# Patient Record
Sex: Male | Born: 1940 | Race: White | Hispanic: No | Marital: Married | Smoking: Former smoker
Health system: Southern US, Community
[De-identification: ages and names within clinical notes are randomized; demographics above are authoritative.]

## PROBLEM LIST (undated history)

## (undated) DIAGNOSIS — F32A Depression, unspecified: Secondary | ICD-10-CM

## (undated) DIAGNOSIS — I10 Essential (primary) hypertension: Secondary | ICD-10-CM

## (undated) DIAGNOSIS — F0280 Dementia in other diseases classified elsewhere without behavioral disturbance: Secondary | ICD-10-CM

## (undated) DIAGNOSIS — M21379 Foot drop, unspecified foot: Secondary | ICD-10-CM

## (undated) DIAGNOSIS — G309 Alzheimer's disease, unspecified: Secondary | ICD-10-CM

## (undated) DIAGNOSIS — H9190 Unspecified hearing loss, unspecified ear: Secondary | ICD-10-CM

## (undated) DIAGNOSIS — F0390 Unspecified dementia without behavioral disturbance: Secondary | ICD-10-CM

## (undated) DIAGNOSIS — F329 Major depressive disorder, single episode, unspecified: Secondary | ICD-10-CM

## (undated) HISTORY — PX: NO PAST SURGERIES: SHX2092

---

## 2013-01-14 ENCOUNTER — Other Ambulatory Visit: Payer: Self-pay

## 2013-01-14 ENCOUNTER — Encounter (HOSPITAL_COMMUNITY): Payer: Self-pay | Admitting: Emergency Medicine

## 2013-01-14 ENCOUNTER — Emergency Department (HOSPITAL_COMMUNITY)
Admission: EM | Admit: 2013-01-14 | Discharge: 2013-01-15 | Disposition: A | Discharge status: Home / Self Care | Payer: Medicare Other | Attending: Emergency Medicine | Admitting: Emergency Medicine

## 2013-01-14 DIAGNOSIS — Y9210 Unspecified residential institution as the place of occurrence of the external cause: Secondary | ICD-10-CM | POA: Insufficient documentation

## 2013-01-14 DIAGNOSIS — IMO0002 Reserved for concepts with insufficient information to code with codable children: Secondary | ICD-10-CM | POA: Insufficient documentation

## 2013-01-14 DIAGNOSIS — W19XXXA Unspecified fall, initial encounter: Secondary | ICD-10-CM

## 2013-01-14 DIAGNOSIS — Z23 Encounter for immunization: Secondary | ICD-10-CM | POA: Insufficient documentation

## 2013-01-14 DIAGNOSIS — E041 Nontoxic single thyroid nodule: Secondary | ICD-10-CM | POA: Insufficient documentation

## 2013-01-14 DIAGNOSIS — S0003XA Contusion of scalp, initial encounter: Secondary | ICD-10-CM | POA: Insufficient documentation

## 2013-01-14 DIAGNOSIS — W050XXA Fall from non-moving wheelchair, initial encounter: Secondary | ICD-10-CM | POA: Insufficient documentation

## 2013-01-14 DIAGNOSIS — Z79899 Other long term (current) drug therapy: Secondary | ICD-10-CM | POA: Insufficient documentation

## 2013-01-14 DIAGNOSIS — F0390 Unspecified dementia without behavioral disturbance: Secondary | ICD-10-CM | POA: Insufficient documentation

## 2013-01-14 DIAGNOSIS — S060X1A Concussion with loss of consciousness of 30 minutes or less, initial encounter: Secondary | ICD-10-CM | POA: Insufficient documentation

## 2013-01-14 DIAGNOSIS — T07XXXA Unspecified multiple injuries, initial encounter: Secondary | ICD-10-CM

## 2013-01-14 DIAGNOSIS — Y9389 Activity, other specified: Secondary | ICD-10-CM | POA: Insufficient documentation

## 2013-01-14 HISTORY — DX: Unspecified dementia, unspecified severity, without behavioral disturbance, psychotic disturbance, mood disturbance, and anxiety: F03.90

## 2013-01-14 NOTE — ED Notes (Signed)
Per EMS, pt is from [facility] with a fall from a wheelchair.  Pt was reaching for an object and fell forward.  Pt became unresponsive for several minutes.  Pt was diaphoretic and his eyes were twitching.  Pt responsive on arrival.  Pt with abrasions to face and cool to touch. Pt has hx of dementia. EKG unremarkable.

## 2013-01-15 ENCOUNTER — Emergency Department (HOSPITAL_COMMUNITY): Payer: Medicare Other

## 2013-01-15 LAB — CBC
MCHC: 34.8 g/dL (ref 30.0–36.0)
MCV: 90.2 fL (ref 78.0–100.0)
Platelets: 174 10*3/uL (ref 150–400)
RDW: 13.1 % (ref 11.5–15.5)
WBC: 5.5 10*3/uL (ref 4.0–10.5)

## 2013-01-15 LAB — BASIC METABOLIC PANEL
BUN: 15 mg/dL (ref 6–23)
Chloride: 106 mEq/L (ref 96–112)
Creatinine, Ser: 0.88 mg/dL (ref 0.50–1.35)
GFR calc Af Amer: >90 mL/min (ref >90)
GFR calc non Af Amer: 84 mL/min — ABNORMAL LOW (ref >90)
Potassium: 3.4 mEq/L — ABNORMAL LOW (ref 3.5–5.1)

## 2013-01-15 LAB — URINALYSIS, ROUTINE W REFLEX MICROSCOPIC
Bilirubin Urine: NEGATIVE
Hgb urine dipstick: NEGATIVE
Ketones, ur: NEGATIVE mg/dL
Leukocytes, UA: NEGATIVE
Nitrite: NEGATIVE
Protein, ur: NEGATIVE mg/dL
Specific Gravity, Urine: 1.023 (ref 1.005–1.030)
Urobilinogen, UA: 1 mg/dL (ref 0.0–1.0)
pH: 6.5 (ref 5.0–8.0)

## 2013-01-15 MED ORDER — POTASSIUM CHLORIDE CRYS ER 20 MEQ PO TBCR
40.0000 meq | EXTENDED_RELEASE_TABLET | Freq: Once | ORAL | Status: AC
  Administered 2013-01-15: 40 meq via ORAL
  Filled 2013-01-15: qty 2

## 2013-01-15 MED ORDER — SODIUM CHLORIDE 0.9 % IV SOLN: INTRAVENOUS | Status: DC

## 2013-01-15 MED ORDER — TETANUS-DIPHTH-ACELL PERTUSSIS 5-2.5-18.5 LF-MCG/0.5 IM SUSP
0.5000 mL | Freq: Once | INTRAMUSCULAR | Status: AC
  Administered 2013-01-15: 0.5 mL via INTRAMUSCULAR
  Filled 2013-01-15: qty 0.5

## 2013-01-15 NOTE — ED Notes (Signed)
Spoke with Tiffany from [facility].  Per facility, pt may have either fell forward and hit head against the wall or he may have been banging his head against the wall.  Pt with abrasion to face and right shoulder.  Per facility pt is known to be combative.

## 2013-01-15 NOTE — ED Provider Notes (Signed)
CSN: [id number]     Arrival date & time 01/14/13  2350 History   First MD Initiated Contact with Patient 01/14/13 2355     Chief Complaint  Patient presents with  . Fall   (Consider location/radiation/quality/duration/timing/severity/associated sxs/prior Treatment) HPI History provided by patient, the EMS report and nursing home. Has a history of dementia. Brought in by EMS report patient was sitting in a wheelchair fell forward striking his head with questionable brief LOC lasting a few minutes. On my valuations patient is awake, unable to recall events from earlier tonight. He denies any pain injury or trauma. Family present at bedside, confirmed history of dementia and states patient is at his baseline mentation currently. Level V caveat applies Past Medical History  Diagnosis Date  . Dementia    History reviewed. No pertinent past surgical history. History reviewed. No pertinent family history. History  Substance Use Topics  . Smoking status: Not on file  . Smokeless tobacco: Not on file  . Alcohol Use: Not on file    Review of Systems  Unable to perform ROS level V the caveat history of dementia  Allergies  Review of patient's allergies indicates no known allergies.  Home Medications   Current Outpatient Rx  Name  Route  Sig  Dispense  Refill  . acetaminophen (TYLENOL) 500 MG tablet   Oral   Take 500 mg by mouth every 4 (four) hours as needed for fever.         Marland Kitchen alum & mag hydroxide-simeth (MAALOX/MYLANTA) 200-200-20 MG/5ML suspension   Oral   Take 30 mLs by mouth every 6 (six) hours as needed for indigestion or heartburn.         Marland Kitchen atorvastatin (LIPITOR) 80 MG tablet   Oral   Take 80 mg by mouth daily.         . enalapril (VASOTEC) 20 MG tablet   Oral   Take 20 mg by mouth daily.         Marland Kitchen guaiFENesin (ROBITUSSIN) 100 MG/5ML SOLN   Oral   Take 10 mLs by mouth every 6 (six) hours as needed for cough or to loosen phlegm.         .  hydrochlorothiazide (HYDRODIURIL) 12.5 MG tablet   Oral   Take 12.5 mg by mouth daily.         Marland Kitchen loperamide (IMODIUM A-D) 2 MG tablet   Oral   Take 2 mg by mouth as needed for diarrhea or loose stools.         Marland Kitchen LORazepam (ATIVAN) 0.5 MG tablet   Oral   Take 0.5 mg by mouth 2 (two) times daily.         Marland Kitchen LORazepam (ATIVAN) 0.5 MG tablet   Oral   Take 0.5 mg by mouth every 6 (six) hours as needed for anxiety. Not to exceed 4 in 24 hours         . magnesium hydroxide (MILK OF MAGNESIA) 400 MG/5ML suspension   Oral   Take 30 mLs by mouth daily as needed for mild constipation.         . metoprolol (LOPRESSOR) 100 MG tablet   Oral   Take 100 mg by mouth 2 (two) times daily.         Marland Kitchen valproic acid (DEPAKENE) 250 MG capsule   Oral   Take 750 mg by mouth 2 (two) times daily.          BP 144/76  Pulse 84  Temp(Src) 98 F (36.7 C) (Oral)  SpO2 96% Physical Exam  Constitutional: He appears well-developed and well-nourished.  HENT:  Head: Normocephalic.  Abrasions to right forehead and right maxillary and mandibular region with some swelling. No trismus. No epistaxis. No entrapment with extraocular movements intact. TMs clear. No mastoid tenderness or swelling.  Eyes: EOM are normal. Pupils are equal, round, and reactive to light.  Neck: Neck supple.  No cervical spine tenderness or deformity  Cardiovascular: Normal rate, regular rhythm and intact distal pulses.   Pulmonary/Chest: Effort normal and breath sounds normal. No respiratory distress.  Abdominal: Soft. Bowel sounds are normal. He exhibits no distension. There is no tenderness.  Musculoskeletal: Normal range of motion. He exhibits no edema.  Abrasion over anterior right shoulder without swelling or deformity otherwise. Good range of motion with distal neurovascular intact x4  Neurological:  Awake, alert, oriented to self, no focal deficits  Skin: Skin is warm and dry.    ED Course  Procedures  (including critical care time) Labs Review Labs Reviewed  BASIC METABOLIC PANEL - Abnormal; Notable for the following:    Potassium 3.4 (*)    Glucose, Bld 114 (*)    GFR calc non Af Amer 84 (*)    All other components within normal limits  CBC  URINALYSIS, ROUTINE W REFLEX MICROSCOPIC   Imaging Review Dg Shoulder Right  01/15/2013   CLINICAL DATA:  Fall and abrasions to the shoulder.  EXAM: RIGHT SHOULDER - 2+ VIEW  COMPARISON:  None.  FINDINGS: The right shoulder is located. Negative for an acute fracture. Right AC joint is intact. Visualized right ribs are intact.  IMPRESSION: No acute bone abnormality to the right shoulder.   Electronically Signed   By: Richarda Overlie M.D.   On: 01/15/2013 01:12   Ct Head Wo Contrast  01/15/2013   CLINICAL DATA:  Status post fall from wheelchair. Patient was unresponsive for several minutes, with diaphoresis and eye twitching. Abrasions on the face. Concern for head or cervical spine injury.  EXAM: CT HEAD WITHOUT CONTRAST  CT MAXILLOFACIAL WITHOUT CONTRAST  CT CERVICAL SPINE WITHOUT CONTRAST  TECHNIQUE: Multidetector CT imaging of the head, cervical spine, and maxillofacial structures were performed using the standard protocol without intravenous contrast. Multiplanar CT image reconstructions of the cervical spine and maxillofacial structures were also generated.  COMPARISON:  None.  FINDINGS: CT HEAD FINDINGS  There is no evidence of acute infarction, mass lesion, or intra- or extra-axial hemorrhage on CT.  Prominence of the ventricles and sulci reflects mild to moderate cortical volume loss. Cerebellar atrophy is noted. Scattered periventricular white matter change likely reflects small vessel ischemic microangiopathy.  The brainstem and fourth ventricle are within normal limits. The basal ganglia are unremarkable in appearance. The cerebral hemispheres demonstrate grossly normal gray-white differentiation. No mass effect or midline shift is seen.  There is no  evidence of fracture; visualized osseous structures are unremarkable in appearance. The visualized portions of the orbits are within normal limits. The paranasal sinuses and mastoid air cells are well-aerated. Mild focal soft tissue injury is noted overlying the right zygomaticomaxillary complex.  CT MAXILLOFACIAL FINDINGS  There is no evidence of fracture or dislocation. The maxilla and mandible appear intact. The nasal bone is unremarkable in appearance. There is chronic absence of the dentition.  The orbits are intact bilaterally. The visualized paranasal sinuses and mastoid air cells are well-aerated.  Mild soft tissue swelling is noted overlying the right zygomaticomaxillary complex. The parapharyngeal fat planes  are preserved. The nasopharynx, oropharynx and hypopharynx are unremarkable in appearance. The visualized portions of the valleculae and piriform sinuses are grossly unremarkable.  The parotid and submandibular glands are within normal limits. No cervical lymphadenopathy is seen.  CT CERVICAL SPINE FINDINGS  There is no evidence of fracture or subluxation. Vertebral bodies demonstrate normal height. Mild reversal of the normal lordotic curvature of the cervical spine is chronic in nature. There is multilevel disc space narrowing along the lower cervical and upper thoracic spine, with chronic endplate irregularity, and scattered small anterior disc osteophyte complexes. Prevertebral soft tissues are within normal limits.  Focal hypodensity is noted within the right thyroid lobe, with peripheral calcification, measuring 1.6 cm. The visualized lung apices are clear. Calcification is noted at the carotid bifurcations bilaterally.  IMPRESSION: 1. No evidence of traumatic intracranial injury or fracture. 2. No evidence of fracture or subluxation along the cervical spine. 3. No evidence of fracture with regard to the maxillofacial structures. 4. Mild soft tissue swelling overlying the right  zygomaticomaxillary complex. 5. Mild degenerative change noted along the lower cervical and upper thoracic spine. 6. Mild to moderate cortical volume loss and scattered small vessel ischemic microangiopathy. 7. Calcification at the carotid bifurcations bilaterally. 8. Focal hypodensity within the right thyroid lobe, with peripheral calcification, measuring 1.6 cm. Consider further evaluation with thyroid ultrasound. If patient is clinically hyperthyroid, consider nuclear medicine thyroid uptake and scan.   Electronically Signed   By: Roanna Raider M.D.   On: 01/15/2013 01:27   Ct Cervical Spine Wo Contrast  01/15/2013   CLINICAL DATA:  Status post fall from wheelchair. Patient was unresponsive for several minutes, with diaphoresis and eye twitching. Abrasions on the face. Concern for head or cervical spine injury.  EXAM: CT HEAD WITHOUT CONTRAST  CT MAXILLOFACIAL WITHOUT CONTRAST  CT CERVICAL SPINE WITHOUT CONTRAST  TECHNIQUE: Multidetector CT imaging of the head, cervical spine, and maxillofacial structures were performed using the standard protocol without intravenous contrast. Multiplanar CT image reconstructions of the cervical spine and maxillofacial structures were also generated.  COMPARISON:  None.  FINDINGS: CT HEAD FINDINGS  There is no evidence of acute infarction, mass lesion, or intra- or extra-axial hemorrhage on CT.  Prominence of the ventricles and sulci reflects mild to moderate cortical volume loss. Cerebellar atrophy is noted. Scattered periventricular white matter change likely reflects small vessel ischemic microangiopathy.  The brainstem and fourth ventricle are within normal limits. The basal ganglia are unremarkable in appearance. The cerebral hemispheres demonstrate grossly normal gray-white differentiation. No mass effect or midline shift is seen.  There is no evidence of fracture; visualized osseous structures are unremarkable in appearance. The visualized portions of the orbits are  within normal limits. The paranasal sinuses and mastoid air cells are well-aerated. Mild focal soft tissue injury is noted overlying the right zygomaticomaxillary complex.  CT MAXILLOFACIAL FINDINGS  There is no evidence of fracture or dislocation. The maxilla and mandible appear intact. The nasal bone is unremarkable in appearance. There is chronic absence of the dentition.  The orbits are intact bilaterally. The visualized paranasal sinuses and mastoid air cells are well-aerated.  Mild soft tissue swelling is noted overlying the right zygomaticomaxillary complex. The parapharyngeal fat planes are preserved. The nasopharynx, oropharynx and hypopharynx are unremarkable in appearance. The visualized portions of the valleculae and piriform sinuses are grossly unremarkable.  The parotid and submandibular glands are within normal limits. No cervical lymphadenopathy is seen.  CT CERVICAL SPINE FINDINGS  There is no evidence  of fracture or subluxation. Vertebral bodies demonstrate normal height. Mild reversal of the normal lordotic curvature of the cervical spine is chronic in nature. There is multilevel disc space narrowing along the lower cervical and upper thoracic spine, with chronic endplate irregularity, and scattered small anterior disc osteophyte complexes. Prevertebral soft tissues are within normal limits.  Focal hypodensity is noted within the right thyroid lobe, with peripheral calcification, measuring 1.6 cm. The visualized lung apices are clear. Calcification is noted at the carotid bifurcations bilaterally.  IMPRESSION: 1. No evidence of traumatic intracranial injury or fracture. 2. No evidence of fracture or subluxation along the cervical spine. 3. No evidence of fracture with regard to the maxillofacial structures. 4. Mild soft tissue swelling overlying the right zygomaticomaxillary complex. 5. Mild degenerative change noted along the lower cervical and upper thoracic spine. 6. Mild to moderate cortical  volume loss and scattered small vessel ischemic microangiopathy. 7. Calcification at the carotid bifurcations bilaterally. 8. Focal hypodensity within the right thyroid lobe, with peripheral calcification, measuring 1.6 cm. Consider further evaluation with thyroid ultrasound. If patient is clinically hyperthyroid, consider nuclear medicine thyroid uptake and scan.   Electronically Signed   By: Roanna Raider M.D.   On: 01/15/2013 01:27   Ct Maxillofacial Wo Cm  01/15/2013   CLINICAL DATA:  Status post fall from wheelchair. Patient was unresponsive for several minutes, with diaphoresis and eye twitching. Abrasions on the face. Concern for head or cervical spine injury.  EXAM: CT HEAD WITHOUT CONTRAST  CT MAXILLOFACIAL WITHOUT CONTRAST  CT CERVICAL SPINE WITHOUT CONTRAST  TECHNIQUE: Multidetector CT imaging of the head, cervical spine, and maxillofacial structures were performed using the standard protocol without intravenous contrast. Multiplanar CT image reconstructions of the cervical spine and maxillofacial structures were also generated.  COMPARISON:  None.  FINDINGS: CT HEAD FINDINGS  There is no evidence of acute infarction, mass lesion, or intra- or extra-axial hemorrhage on CT.  Prominence of the ventricles and sulci reflects mild to moderate cortical volume loss. Cerebellar atrophy is noted. Scattered periventricular white matter change likely reflects small vessel ischemic microangiopathy.  The brainstem and fourth ventricle are within normal limits. The basal ganglia are unremarkable in appearance. The cerebral hemispheres demonstrate grossly normal gray-white differentiation. No mass effect or midline shift is seen.  There is no evidence of fracture; visualized osseous structures are unremarkable in appearance. The visualized portions of the orbits are within normal limits. The paranasal sinuses and mastoid air cells are well-aerated. Mild focal soft tissue injury is noted overlying the right  zygomaticomaxillary complex.  CT MAXILLOFACIAL FINDINGS  There is no evidence of fracture or dislocation. The maxilla and mandible appear intact. The nasal bone is unremarkable in appearance. There is chronic absence of the dentition.  The orbits are intact bilaterally. The visualized paranasal sinuses and mastoid air cells are well-aerated.  Mild soft tissue swelling is noted overlying the right zygomaticomaxillary complex. The parapharyngeal fat planes are preserved. The nasopharynx, oropharynx and hypopharynx are unremarkable in appearance. The visualized portions of the valleculae and piriform sinuses are grossly unremarkable.  The parotid and submandibular glands are within normal limits. No cervical lymphadenopathy is seen.  CT CERVICAL SPINE FINDINGS  There is no evidence of fracture or subluxation. Vertebral bodies demonstrate normal height. Mild reversal of the normal lordotic curvature of the cervical spine is chronic in nature. There is multilevel disc space narrowing along the lower cervical and upper thoracic spine, with chronic endplate irregularity, and scattered small anterior disc osteophyte complexes.  Prevertebral soft tissues are within normal limits.  Focal hypodensity is noted within the right thyroid lobe, with peripheral calcification, measuring 1.6 cm. The visualized lung apices are clear. Calcification is noted at the carotid bifurcations bilaterally.  IMPRESSION: 1. No evidence of traumatic intracranial injury or fracture. 2. No evidence of fracture or subluxation along the cervical spine. 3. No evidence of fracture with regard to the maxillofacial structures. 4. Mild soft tissue swelling overlying the right zygomaticomaxillary complex. 5. Mild degenerative change noted along the lower cervical and upper thoracic spine. 6. Mild to moderate cortical volume loss and scattered small vessel ischemic microangiopathy. 7. Calcification at the carotid bifurcations bilaterally. 8. Focal hypodensity  within the right thyroid lobe, with peripheral calcification, measuring 1.6 cm. Consider further evaluation with thyroid ultrasound. If patient is clinically hyperthyroid, consider nuclear medicine thyroid uptake and scan.   Electronically Signed   By: Roanna Raider M.D.   On: 01/15/2013 01:27     Date: 01/15/2013  Rate: 84  Rhythm: normal sinus rhythm  QRS Axis: left  Intervals: normal  ST/T Wave abnormalities: nonspecific ST changes  Conduction Disutrbances:none  Narrative Interpretation:   Old EKG Reviewed: none available  Called nursing facility and at this time staff uncertain of what happened during shift change. Patient has significant dementia and does not provide any reliable history.   1:57 AM resting comfortably, family bedside, and tolerates by mouth fluids and potassium provided.  Family comfortable the plan discharge back to facility, continue medications as prescribed close followup with primary care physician.  MDM  Diagnosis: Fall, multiple contusions, multiple abrasions, hypokalemia, thyroid nodule incidental finding  CT scans, EKG and labs reviewed as above. Medications provided. Vital signs and nurses notes reviewed and considered.   Sunnie Nielsen, MD 01/15/13 [phone]

## 2013-02-10 ENCOUNTER — Inpatient Hospital Stay (HOSPITAL_COMMUNITY)
Admission: EM | Admit: 2013-02-10 | Discharge: 2013-02-12 | DRG: 194 | Disposition: A | Discharge status: Nursing Home (Includes ICF) | Payer: Medicare Other | Attending: Internal Medicine | Admitting: Internal Medicine

## 2013-02-10 ENCOUNTER — Emergency Department (HOSPITAL_COMMUNITY): Payer: Medicare Other

## 2013-02-10 ENCOUNTER — Encounter (HOSPITAL_COMMUNITY): Payer: Self-pay | Admitting: Emergency Medicine

## 2013-02-10 DIAGNOSIS — R509 Fever, unspecified: Secondary | ICD-10-CM | POA: Diagnosis present

## 2013-02-10 DIAGNOSIS — E871 Hypo-osmolality and hyponatremia: Secondary | ICD-10-CM | POA: Diagnosis present

## 2013-02-10 DIAGNOSIS — F3289 Other specified depressive episodes: Secondary | ICD-10-CM | POA: Diagnosis present

## 2013-02-10 DIAGNOSIS — E785 Hyperlipidemia, unspecified: Secondary | ICD-10-CM | POA: Diagnosis present

## 2013-02-10 DIAGNOSIS — H9190 Unspecified hearing loss, unspecified ear: Secondary | ICD-10-CM | POA: Diagnosis present

## 2013-02-10 DIAGNOSIS — J189 Pneumonia, unspecified organism: Principal | ICD-10-CM | POA: Diagnosis present

## 2013-02-10 DIAGNOSIS — I10 Essential (primary) hypertension: Secondary | ICD-10-CM | POA: Diagnosis present

## 2013-02-10 DIAGNOSIS — Z79899 Other long term (current) drug therapy: Secondary | ICD-10-CM

## 2013-02-10 DIAGNOSIS — G40909 Epilepsy, unspecified, not intractable, without status epilepticus: Secondary | ICD-10-CM | POA: Diagnosis present

## 2013-02-10 DIAGNOSIS — F329 Major depressive disorder, single episode, unspecified: Secondary | ICD-10-CM | POA: Diagnosis present

## 2013-02-10 DIAGNOSIS — Z66 Do not resuscitate: Secondary | ICD-10-CM | POA: Diagnosis present

## 2013-02-10 DIAGNOSIS — F0390 Unspecified dementia without behavioral disturbance: Secondary | ICD-10-CM | POA: Diagnosis present

## 2013-02-10 HISTORY — DX: Essential (primary) hypertension: I10

## 2013-02-10 HISTORY — DX: Foot drop, unspecified foot: M21.379

## 2013-02-10 HISTORY — DX: Major depressive disorder, single episode, unspecified: F32.9

## 2013-02-10 HISTORY — DX: Unspecified hearing loss, unspecified ear: H91.90

## 2013-02-10 HISTORY — DX: Depression, unspecified: F32.A

## 2013-02-10 LAB — URINALYSIS, ROUTINE W REFLEX MICROSCOPIC
Bilirubin Urine: NEGATIVE
Glucose, UA: NEGATIVE mg/dL
Hgb urine dipstick: NEGATIVE
Ketones, ur: NEGATIVE mg/dL
Leukocytes, UA: NEGATIVE
Nitrite: NEGATIVE
Protein, ur: NEGATIVE mg/dL
Specific Gravity, Urine: 1.017 (ref 1.005–1.030)
Urobilinogen, UA: 1 mg/dL (ref 0.0–1.0)
pH: 7.5 (ref 5.0–8.0)

## 2013-02-10 LAB — BLOOD GAS, ARTERIAL
Acid-Base Excess: 1.2 mmol/L (ref 0.0–2.0)
Bicarbonate: 23.4 mEq/L (ref 20.0–24.0)
Drawn by: 307971
FIO2: 0.21 %
O2 Saturation: 94.1 %
Patient temperature: 98.6
TCO2: 20.2 mmol/L (ref 0–100)
pCO2 arterial: 31 mmHg — ABNORMAL LOW (ref 35.0–45.0)
pH, Arterial: 7.49 — ABNORMAL HIGH (ref 7.350–7.450)
pO2, Arterial: 65.2 mmHg — ABNORMAL LOW (ref 80.0–100.0)

## 2013-02-10 LAB — CBC WITH DIFFERENTIAL/PLATELET
Basophils Absolute: 0 10*3/uL (ref 0.0–0.1)
Basophils Relative: 0 % (ref 0–1)
Eosinophils Absolute: 0 10*3/uL (ref 0.0–0.7)
Eosinophils Relative: 0 % (ref 0–5)
HCT: 40.3 % (ref 39.0–52.0)
Hemoglobin: 13.7 g/dL (ref 13.0–17.0)
Lymphocytes Relative: 4 % — ABNORMAL LOW (ref 12–46)
Lymphs Abs: 0.3 10*3/uL — ABNORMAL LOW (ref 0.7–4.0)
MCH: 30 pg (ref 26.0–34.0)
MCHC: 34 g/dL (ref 30.0–36.0)
MCV: 88.2 fL (ref 78.0–100.0)
Monocytes Absolute: 0.6 10*3/uL (ref 0.1–1.0)
Monocytes Relative: 7 % (ref 3–12)
Neutro Abs: 7.1 10*3/uL (ref 1.7–7.7)
Neutrophils Relative %: 89 % — ABNORMAL HIGH (ref 43–77)
Platelets: 162 10*3/uL (ref 150–400)
RBC: 4.57 MIL/uL (ref 4.22–5.81)
RDW: 13.1 % (ref 11.5–15.5)
WBC: 8 10*3/uL (ref 4.0–10.5)

## 2013-02-10 LAB — COMPREHENSIVE METABOLIC PANEL
ALT: 14 U/L (ref 0–53)
AST: 16 U/L (ref 0–37)
Albumin: 3.6 g/dL (ref 3.5–5.2)
Alkaline Phosphatase: 66 U/L (ref 39–117)
BUN: 14 mg/dL (ref 6–23)
CO2: 21 mEq/L (ref 19–32)
Calcium: 9 mg/dL (ref 8.4–10.5)
Chloride: 95 mEq/L — ABNORMAL LOW (ref 96–112)
Creatinine, Ser: 0.78 mg/dL (ref 0.50–1.35)
GFR calc Af Amer: >90 mL/min (ref >90)
GFR calc non Af Amer: 88 mL/min — ABNORMAL LOW (ref >90)
Glucose, Bld: 132 mg/dL — ABNORMAL HIGH (ref 70–99)
Potassium: 3.6 mEq/L — ABNORMAL LOW (ref 3.7–5.3)
Sodium: 132 mEq/L — ABNORMAL LOW (ref 137–147)
Total Bilirubin: 0.4 mg/dL (ref 0.3–1.2)
Total Protein: 7 g/dL (ref 6.0–8.3)

## 2013-02-10 LAB — MRSA PCR SCREENING: MRSA by PCR: NEGATIVE

## 2013-02-10 LAB — TROPONIN I: Troponin I: <0.3 ng/mL (ref <0.30)

## 2013-02-10 MED ORDER — GUAIFENESIN 100 MG/5ML PO SOLN: 10.0000 mL | Freq: Four times a day (QID) | ORAL | Status: DC | PRN

## 2013-02-10 MED ORDER — IPRATROPIUM-ALBUTEROL 0.5-2.5 (3) MG/3ML IN SOLN
3.0000 mL | Freq: Four times a day (QID) | RESPIRATORY_TRACT | Status: DC
  Administered 2013-02-10: 3 mL via RESPIRATORY_TRACT
  Filled 2013-02-10: qty 3

## 2013-02-10 MED ORDER — ENALAPRIL MALEATE 20 MG PO TABS
20.0000 mg | ORAL_TABLET | Freq: Every day | ORAL | Status: DC
  Administered 2013-02-11: 20 mg via ORAL
  Filled 2013-02-10 (×2): qty 1

## 2013-02-10 MED ORDER — ALUM & MAG HYDROXIDE-SIMETH 200-200-20 MG/5ML PO SUSP: 30.0000 mL | Freq: Four times a day (QID) | ORAL | Status: DC | PRN

## 2013-02-10 MED ORDER — ACETAMINOPHEN 325 MG PO TABS
650.0000 mg | ORAL_TABLET | Freq: Four times a day (QID) | ORAL | Status: DC | PRN
  Administered 2013-02-10: 650 mg via ORAL
  Filled 2013-02-10: qty 2

## 2013-02-10 MED ORDER — ACETAMINOPHEN 650 MG RE SUPP
650.0000 mg | Freq: Once | RECTAL | Status: AC
  Administered 2013-02-10: 650 mg via RECTAL
  Filled 2013-02-10: qty 1

## 2013-02-10 MED ORDER — SODIUM CHLORIDE 0.9 % IV BOLUS (SEPSIS)
1000.0000 mL | Freq: Once | INTRAVENOUS | Status: AC
  Administered 2013-02-10: 1000 mL via INTRAVENOUS

## 2013-02-10 MED ORDER — ACETAMINOPHEN 650 MG RE SUPP: 650.0000 mg | Freq: Four times a day (QID) | RECTAL | Status: DC | PRN

## 2013-02-10 MED ORDER — ACETAMINOPHEN 500 MG PO TABS
500.0000 mg | ORAL_TABLET | ORAL | Status: DC | PRN
  Filled 2013-02-10: qty 1

## 2013-02-10 MED ORDER — ZOLPIDEM TARTRATE 5 MG PO TABS: 5.0000 mg | ORAL_TABLET | Freq: Every evening | ORAL | Status: DC | PRN

## 2013-02-10 MED ORDER — ATORVASTATIN CALCIUM 80 MG PO TABS
80.0000 mg | ORAL_TABLET | Freq: Every day | ORAL | Status: DC
Start: 2013-02-10 — End: 2013-02-12
  Administered 2013-02-10: 80 mg via ORAL
  Filled 2013-02-10 (×3): qty 1

## 2013-02-10 MED ORDER — IPRATROPIUM BROMIDE 0.02 % IN SOLN: 0.5000 mg | Freq: Four times a day (QID) | RESPIRATORY_TRACT | Status: DC

## 2013-02-10 MED ORDER — METOPROLOL TARTRATE 100 MG PO TABS
100.0000 mg | ORAL_TABLET | Freq: Two times a day (BID) | ORAL | Status: DC
  Administered 2013-02-10 – 2013-02-11 (×2): 100 mg via ORAL
  Filled 2013-02-10 (×5): qty 1

## 2013-02-10 MED ORDER — LEVOFLOXACIN IN D5W 500 MG/100ML IV SOLN
500.0000 mg | INTRAVENOUS | Status: DC
  Administered 2013-02-10 – 2013-02-12 (×2): 500 mg via INTRAVENOUS
  Filled 2013-02-10 (×2): qty 100

## 2013-02-10 MED ORDER — VALPROIC ACID 250 MG PO CAPS
750.0000 mg | ORAL_CAPSULE | Freq: Two times a day (BID) | ORAL | Status: DC
  Administered 2013-02-10 – 2013-02-11 (×2): 750 mg via ORAL
  Filled 2013-02-10 (×5): qty 3

## 2013-02-10 MED ORDER — SODIUM CHLORIDE 0.9 % IV SOLN
INTRAVENOUS | Status: DC
  Administered 2013-02-10 – 2013-02-11 (×2): via INTRAVENOUS

## 2013-02-10 MED ORDER — OSELTAMIVIR PHOSPHATE 75 MG PO CAPS
75.0000 mg | ORAL_CAPSULE | Freq: Two times a day (BID) | ORAL | Status: DC
  Administered 2013-02-10 – 2013-02-11 (×2): 75 mg via ORAL
  Filled 2013-02-10 (×3): qty 1

## 2013-02-10 MED ORDER — HYDROCODONE-ACETAMINOPHEN 5-325 MG PO TABS: 1.0000 | ORAL_TABLET | ORAL | Status: DC | PRN

## 2013-02-10 MED ORDER — ENOXAPARIN SODIUM 40 MG/0.4ML ~~LOC~~ SOLN
40.0000 mg | SUBCUTANEOUS | Status: DC
  Administered 2013-02-10 – 2013-02-11 (×2): 40 mg via SUBCUTANEOUS
  Filled 2013-02-10 (×3): qty 0.4

## 2013-02-10 MED ORDER — HYDROCHLOROTHIAZIDE 12.5 MG PO CAPS
12.5000 mg | ORAL_CAPSULE | Freq: Every day | ORAL | Status: DC
  Administered 2013-02-11: 12.5 mg via ORAL
  Filled 2013-02-10 (×2): qty 1

## 2013-02-10 MED ORDER — ONDANSETRON HCL 4 MG PO TABS: 4.0000 mg | ORAL_TABLET | Freq: Four times a day (QID) | ORAL | Status: DC | PRN

## 2013-02-10 MED ORDER — ONDANSETRON HCL 4 MG/2ML IJ SOLN: 4.0000 mg | Freq: Four times a day (QID) | INTRAMUSCULAR | Status: DC | PRN

## 2013-02-10 MED ORDER — ALBUTEROL SULFATE (2.5 MG/3ML) 0.083% IN NEBU: 2.5000 mg | INHALATION_SOLUTION | RESPIRATORY_TRACT | Status: DC | PRN

## 2013-02-10 MED ORDER — ALBUTEROL SULFATE (2.5 MG/3ML) 0.083% IN NEBU: 2.5000 mg | INHALATION_SOLUTION | Freq: Four times a day (QID) | RESPIRATORY_TRACT | Status: DC

## 2013-02-10 NOTE — ED Notes (Signed)
Bed: WA17 Expected date:  Expected time:  Means of arrival:  Comments: EMS/shob/ALOC/hx alzheimer"s

## 2013-02-10 NOTE — ED Notes (Signed)
Call pt's granddaughter, Marchelle Folksmanda, at [phone] to inform her of pt's plan of care.

## 2013-02-10 NOTE — ED Notes (Signed)
Made RT aware ABG been ordered.

## 2013-02-10 NOTE — H&P (Addendum)
[facility]                                                                                    Patient Demographics  Arthur Dunlap, is a 73 y.o. male  CSN: [id number]  MRN: [id number]  DOB - [birth year]  Admit Date - 02/10/2013  Outpatient Primary MD for the patient is Pcp Not In System   With History of -  Past Medical History  Diagnosis Date  . Dementia   . Depression   . Hypertension   . Foot drop   . Hearing loss       History reviewed. No pertinent past surgical history.  in for   Chief Complaint  Patient presents with  . Fatigue  . weakness   . Shortness of Breath     HPI  Arthur Dunlap  is a 73 y.o. male, nursing home resident with history of dementia and hypertension who was brought here because of fatigue weakness shortness of breath. Patient is not a very good historian. I talked to his daughter today who told me that many people in the nursing home had fevers and her mom had bronchitis. The patient has been very sleepy and complains of shortness of breath and cough    Review of Systems    In addition to the HPI above, No Fever-chills, No Headache, No changes with Vision or hearing, No problems swallowing food or Liquids, No Chest pain,  No Abdominal pain, No Nausea or Vommitting, Bowel movements are regular, No Blood in stool or Urine, No dysuria, No new skin rashes or bruises, No new joints pains-aches,  No new weakness, tingling, numbness in any extremity, No recent weight gain or loss, No polyuria, polydypsia or polyphagia, No significant Mental Stressors.  A full 10 point Review of Systems was done, except as stated above, all other Review of Systems were negative.   Social History History  Substance Use Topics  . Smoking status: Unknown If Ever Smoked  . Smokeless tobacco: Not on file  . Alcohol Use: No     Family History Unable to obtain due to patient's condition  Prior to Admission medications   Medication Sig  Start Date End Date Taking? Authorizing Provider  atorvastatin (LIPITOR) 80 MG tablet Take 80 mg by mouth at bedtime.    Yes Historical Provider, MD  enalapril (VASOTEC) 20 MG tablet Take 20 mg by mouth daily.   Yes Historical Provider, MD  hydrochlorothiazide (HYDRODIURIL) 12.5 MG tablet Take 12.5 mg by mouth daily.   Yes Historical Provider, MD  Melatonin 3 MG TABS Take 1 tablet by mouth at bedtime.   Yes Historical Provider, MD  metoprolol (LOPRESSOR) 100 MG tablet Take 100 mg by mouth 2 (two) times daily.   Yes Historical Provider, MD  valproic acid (DEPAKENE) 250 MG capsule Take 750 mg by mouth 2 (two) times daily.   Yes Historical Provider, MD  acetaminophen (TYLENOL) 500 MG tablet Take 500 mg by mouth every 4 (four) hours as needed for fever.    Historical Provider, MD  alum & mag hydroxide-simeth (MAALOX/MYLANTA) 200-200-20 MG/5ML suspension Take 30 mLs by mouth every 6 (six) hours as  needed for indigestion or heartburn.    Historical Provider, MD  guaiFENesin (ROBITUSSIN) 100 MG/5ML SOLN Take 10 mLs by mouth every 6 (six) hours as needed for cough or to loosen phlegm.    Historical Provider, MD  loperamide (IMODIUM A-D) 2 MG tablet Take 2 mg by mouth as needed for diarrhea or loose stools.    Historical Provider, MD  magnesium hydroxide (MILK OF MAGNESIA) 400 MG/5ML suspension Take 30 mLs by mouth daily as needed for mild constipation.    Historical Provider, MD    No Known Allergies  Physical Exam  Vitals  Blood pressure 117/48, pulse 109, temperature 102.4 F (39.1 C), temperature source Rectal, resp. rate 24, SpO2 96.00%.   1. General elderly white male, drowsy, in mild respiratory distress  2. Normal affect and insight, Not Suicidal or Homicidal, confused.  3. No F.N deficits, ALL C.Nerves Intact, Strength 5/5 all 4 extremities, Sensation intact all 4 extremities, Plantars down going.  4. Ears and Eyes appear Normal, Conjunctivae clear, PERRLA. Moist Oral Mucosa.  5.  Supple Neck, No JVD, No cervical lymphadenopathy appriciated, No Carotid Bruits.  6. Symmetrical Chest wall movement,  mild scattered rhonchi.  7. RRR, No Gallops, Rubs or Murmurs, No Parasternal Heave.  8. Positive Bowel Sounds, Abdomen Soft, Non tender, No organomegaly appriciated,No rebound -guarding or rigidity.  9.  No Cyanosis, Normal Skin Turgor, No Skin Rash or Bruise.  10. Good muscle tone,  joints appear normal , no effusions, Normal ROM.  11. No Palpable Lymph Nodes in Neck or Axillae    Data Review  CBC  Recent Labs Lab 02/10/13 1355  WBC 8.0  HGB 13.7  HCT 40.3  PLT 162  MCV 88.2  MCH 30.0  MCHC 34.0  RDW 13.1  LYMPHSABS 0.3*  MONOABS 0.6  EOSABS 0.0  BASOSABS 0.0   ------------------------------------------------------------------------------------------------------------------  Chemistries   Recent Labs Lab 02/10/13 1355  NA 132*  K 3.6*  CL 95*  CO2 21  GLUCOSE 132*  BUN 14  CREATININE 0.78  CALCIUM 9.0  AST 16  ALT 14  ALKPHOS 66  BILITOT 0.4   Cardiac Enzymes  Recent Labs Lab 02/10/13 1550  TROPONINI <0.30     ---------------------------------------------------------------------------------------------------------------  Urinalysis    Component Value Date/Time   COLORURINE YELLOW 02/10/2013 1433   APPEARANCEUR CLEAR 02/10/2013 1433   LABSPEC 1.017 02/10/2013 1433   PHURINE 7.5 02/10/2013 1433   GLUCOSEU NEGATIVE 02/10/2013 1433   HGBUR NEGATIVE 02/10/2013 1433   BILIRUBINUR NEGATIVE 02/10/2013 1433   KETONESUR NEGATIVE 02/10/2013 1433   PROTEINUR NEGATIVE 02/10/2013 1433   UROBILINOGEN 1.0 02/10/2013 1433   NITRITE NEGATIVE 02/10/2013 1433   LEUKOCYTESUR NEGATIVE 02/10/2013 1433    ----------------------------------------------------------------------------------------------------------------    Imaging results:   Dg Chest 2 View  02/10/2013   CLINICAL DATA:  Short of breath and wheezing  EXAM: CHEST  2 VIEW  COMPARISON:  None.   FINDINGS: Exam is lordotic. Normal cardiac silhouette. Low lung volumes. Bibasilar atelectasis. No effusion, infiltrate, or pneumothorax.  IMPRESSION: Low lung volumes and basilar atelectasis. No evidence of pneumonia or edema.   Electronically Signed   By: Genevive Bi M.D.   On: 02/10/2013 15:13   Ct Head Wo Contrast  02/10/2013   CLINICAL DATA:  Fatigue. Shortness of breath. Lethargy and weakness.  EXAM: CT HEAD WITHOUT CONTRAST  TECHNIQUE: Contiguous axial images were obtained from the base of the skull through the vertex without intravenous contrast.  COMPARISON:  01/15/2013  FINDINGS: The brainstem,  cerebellum, cerebral peduncles, thalamus, basal ganglia, basilar cisterns, and ventricular system appear within normal limits. Periventricular white matter and corona radiata hypodensities favor chronic ischemic microvascular white matter disease. No intracranial hemorrhage, mass lesion, or acute CVA.  Mild chronic ethmoid sinusitis.  IMPRESSION: 1. No acute intracranial findings. 2. Periventricular white matter and corona radiata hypodensities favor chronic ischemic microvascular white matter disease. 3. Mild chronic ethmoid sinusitis.   Electronically Signed   By: Herbie Baltimore M.D.   On: 02/10/2013 16:29    My personal review of EKG: Sinus tach, no acute changes    Assessment & Plan  1. fever, influenza-like illness, check cultures start Tamiflu and Levaquin. Chest x-ray negative, wife was being treated for bronchitis. IV fluids are also started , check influenza by PCR 2. Advanced dementia 3. Depression 4. Hypertension 5. History of hearing loss 6. Mild hyponatremia; continue with IV normal saline     DVT Prophylaxis Lovenox    Family Communication: Admission, patients condition and plan of care including tests being ordered have been discussed with the daughter who indicates understanding and agrees with the plan and Code Status.  Code Status DO NOT RESUSCITATE  Disposition  Plan: Back to the nursing home  Time spent in minutes : 28 minutes  Condition GUARDED

## 2013-02-10 NOTE — ED Notes (Signed)
Per EMS [facility] for lethargy, weakness, and shob.  Per facility staff pt normally roaming the hallways but dont know how long pt has been in this state.

## 2013-02-10 NOTE — ED Notes (Signed)
Pt resting quietly; looking around room; states "I'm doing good" when spoken to; denies any c/o pain at present; tel sinus tach 107; nonproductive cough noted; siderails up x 2; call bell within reach; within site of nurses station at present

## 2013-02-11 ENCOUNTER — Encounter (HOSPITAL_COMMUNITY): Payer: Self-pay | Admitting: *Deleted

## 2013-02-11 DIAGNOSIS — J189 Pneumonia, unspecified organism: Principal | ICD-10-CM

## 2013-02-11 DIAGNOSIS — G40909 Epilepsy, unspecified, not intractable, without status epilepticus: Secondary | ICD-10-CM

## 2013-02-11 DIAGNOSIS — E785 Hyperlipidemia, unspecified: Secondary | ICD-10-CM | POA: Diagnosis present

## 2013-02-11 LAB — INFLUENZA PANEL BY PCR (TYPE A & B)
H1N1 flu by pcr: NOT DETECTED
Influenza A By PCR: NEGATIVE
Influenza B By PCR: NEGATIVE

## 2013-02-11 LAB — STREP PNEUMONIAE URINARY ANTIGEN: Strep Pneumo Urinary Antigen: POSITIVE — AB

## 2013-02-11 LAB — LEGIONELLA ANTIGEN, URINE: Legionella Antigen, Urine: NEGATIVE

## 2013-02-11 MED ORDER — IPRATROPIUM-ALBUTEROL 0.5-2.5 (3) MG/3ML IN SOLN
3.0000 mL | Freq: Four times a day (QID) | RESPIRATORY_TRACT | Status: DC
  Administered 2013-02-11: 3 mL via RESPIRATORY_TRACT
  Filled 2013-02-11: qty 3

## 2013-02-11 MED ORDER — IPRATROPIUM-ALBUTEROL 0.5-2.5 (3) MG/3ML IN SOLN
3.0000 mL | Freq: Four times a day (QID) | RESPIRATORY_TRACT | Status: DC
  Administered 2013-02-11: 3 mL via RESPIRATORY_TRACT
  Filled 2013-02-11 (×2): qty 3

## 2013-02-11 MED ORDER — LEVOFLOXACIN 500 MG PO TABS
500.0000 mg | ORAL_TABLET | Freq: Every day | ORAL | Status: DC
  Filled 2013-02-11 (×3): qty 1

## 2013-02-11 NOTE — Progress Notes (Signed)
Clinical Social Work Department BRIEF PSYCHOSOCIAL ASSESSMENT 02/11/2013  Patient:  Arthur Dunlap,Arthur Dunlap     Account Number:  [id number]     Admit date:  02/10/2013  Clinical Social Worker:  Arthur Dunlap,Arthur Dunlap, LCSWA  Date/Time:  02/11/2013 02:20 PM  Referred by:  Physician  Date Referred:  02/11/2013 Referred for  ALF Placement   Other Referral:   Interview type:  Family Other interview type:    PSYCHOSOCIAL DATA Living Status:  FACILITY Admitted from facility[facility]  [facility] Level of care:  Assisted Living Primary support name:  Arthur Dunlap/daughter/[phone] Primary support relationship to patient:  CHILD, ADULT Degree of support available:   adequate    CURRENT CONCERNS Current Concerns  Post-Acute Placement   Other Concerns:   [facility] ([facility]    SOCIAL WORK ASSESSMENT / PLAN CSW received referral that [facility].    CSW reviewed chart and noted that pt oriented to person only and unable to adequately participate in conversation. CSW contacted pt daughter, Arthur Dunlap via telephone.    Pt daughter confirmed that pt is a [facility] at [facility] and pt family would like for pt to return to ALF upon discharge. Pt daughter stated that she was awaiting a phone call from MD re: results of pt flu test. CSW notified pt daughter that CSW would leave note requesting MD to contact pt daughter with an update. Pt daughter expressed appreciation.    CSW contacted [facility] who confirmed that pt could return when medically stable for discharge and CSW can contact facility when pt medically ready.    CSW to continue to follow and facilitate pt discharge needs when pt medically ready for discharge.   Assessment/plan status:  Psychosocial Support/Ongoing Assessment of Needs Other assessment/ plan:   discharge planning   Information/referral to  community resources:   Referral back to [facility]    PATIENT'S/FAMILY'S RESPONSE TO PLAN OF CARE: Pt unable to engage in assessment. Pt daughter appears supportive and actively involved in pt care and is eager to receive update from MD. [facility] willing to [facility] back upon pt being medically stable for return to ALF.     Arthur Dunlap, MSW, LCSW Clinical Social Work [phone]

## 2013-02-11 NOTE — Progress Notes (Addendum)
[facility] PROGRESS NOTE  Arthur Dunlap ZOX:[id number] DOB: 1940/12/26 DOA: 02/10/2013 PCP: Pcp Not In System  Assessment/Plan: Principal Problem:   CAP (community acquired pneumonia): On Levaquin. No further fevers. Strep pneumo antigen positive. Flu titer negative. Active Problems:   Fever: Secondary to pneumonia.   Dementia: Ruling out aspiration. Checking swallow eval.   Hypertension: Continue blood pressure meds.    Hearing loss   Seizure disorder: Continue Depakote.    Other and unspecified hyperlipidemia  Code Status: DO NOT RESUSCITATE  Family Communication: Updated daughter by phone.  Disposition Plan: Improved, back to assisted living possibly tomorrow   Consultants:  Speech therapy  Procedures:  None  Antibiotics:  Levaquin day 2  HPI/Subjective: Patient somnolent, does not really respond to my questions  Objective: Filed Vitals:   02/11/13 1406  BP: 115/68  Pulse: 73  Temp: 97.8 F (36.6 C)  Resp: 16    Intake/Output Summary (Last 24 hours) at 02/11/13 1918 Last data filed at 02/11/13 1900  Gross per 24 hour  Intake    780 ml  Output   1050 ml  Net   -270 ml   Filed Weights   02/10/13 2130  Weight: 87.9 kg (193 lb 12.6 oz)    Exam:   General:  Somnolent  Cardiovascular: Regular rate and rhythm, S1-S2  Respiratory: Coarse breath sounds  Abdomen: Soft, nontender, nondistended, positive bowel sounds  Musculoskeletal: No clubbing or cyanosis or edema   Data Reviewed: Basic Metabolic Panel:  Recent Labs Lab 02/10/13 1355  NA 132*  K 3.6*  CL 95*  CO2 21  GLUCOSE 132*  BUN 14  CREATININE 0.78  CALCIUM 9.0   Liver Function Tests:  Recent Labs Lab 02/10/13 1355  AST 16  ALT 14  ALKPHOS 66  BILITOT 0.4  PROT 7.0  ALBUMIN 3.6   No results found for this basename: LIPASE, AMYLASE,  in the last 168 hours No results found for this basename: AMMONIA,  in the last 168 hours CBC:  Recent Labs Lab  02/10/13 1355  WBC 8.0  NEUTROABS 7.1  HGB 13.7  HCT 40.3  MCV 88.2  PLT 162   Cardiac Enzymes:  Recent Labs Lab 02/10/13 1550  TROPONINI <0.30   BNP (last 3 results) No results found for this basename: PROBNP,  in the last 8760 hours CBG: No results found for this basename: GLUCAP,  in the last 168 hours  Recent Results (from the past 240 hour(s))  CULTURE, BLOOD (ROUTINE X 2)     Status: None   Collection Time    02/10/13  1:55 PM      Result Value Range Status   Specimen Description BLOOD RIGHT ARM  5 ML IN [facility] BOTTLE   Final   Special Requests NONE   Final   Culture  Setup Time     Final   Value: 02/10/2013 22:11     Performed at [facility]   Culture     Final   Value:        BLOOD CULTURE RECEIVED NO GROWTH TO DATE CULTURE WILL BE HELD FOR 5 DAYS BEFORE ISSUING A FINAL NEGATIVE REPORT     Performed at [facility]   Report Status PENDING   Incomplete  CULTURE, BLOOD (ROUTINE X 2)     Status: None   Collection Time    02/10/13  2:12 PM      Result Value Range Status   Specimen Description BLOOD LEFT ANTECUBITAL  Final   Special Requests BOTTLES DRAWN AEROBIC AND ANAEROBIC 4.5CC   Final   Culture  Setup Time     Final   Value: 02/10/2013 22:11     Performed at [facility]   Culture     Final   Value:        BLOOD CULTURE RECEIVED NO GROWTH TO DATE CULTURE WILL BE HELD FOR 5 DAYS BEFORE ISSUING A FINAL NEGATIVE REPORT     Performed at [facility]   Report Status PENDING   Incomplete  MRSA PCR SCREENING     Status: None   Collection Time    02/10/13  9:45 PM      Result Value Range Status   MRSA by PCR NEGATIVE  NEGATIVE Final   Comment:            The GeneXpert MRSA Assay (FDA     approved for NASAL specimens     only), is one component of a     comprehensive MRSA colonization     surveillance program. It is not     intended to diagnose MRSA     infection nor to guide or     monitor treatment for     MRSA  infections.     Studies: Dg Chest 2 View  02/10/2013   CLINICAL DATA:  Short of breath and wheezing  EXAM: CHEST  2 VIEW  COMPARISON:  None.  FINDINGS: Exam is lordotic. Normal cardiac silhouette. Low lung volumes. Bibasilar atelectasis. No effusion, infiltrate, or pneumothorax.  IMPRESSION: Low lung volumes and basilar atelectasis. No evidence of pneumonia or edema.   Electronically Signed   By: Genevive BiStewart  Edmunds M.D.   On: 02/10/2013 15:13   Ct Head Wo Contrast  02/10/2013   CLINICAL DATA:  Fatigue. Shortness of breath. Lethargy and weakness.  EXAM: CT HEAD WITHOUT CONTRAST  TECHNIQUE: Contiguous axial images were obtained from the base of the skull through the vertex without intravenous contrast.  COMPARISON:  01/15/2013  FINDINGS: The brainstem, cerebellum, cerebral peduncles, thalamus, basal ganglia, basilar cisterns, and ventricular system appear within normal limits. Periventricular white matter and corona radiata hypodensities favor chronic ischemic microvascular white matter disease. No intracranial hemorrhage, mass lesion, or acute CVA.  Mild chronic ethmoid sinusitis.  IMPRESSION: 1. No acute intracranial findings. 2. Periventricular white matter and corona radiata hypodensities favor chronic ischemic microvascular white matter disease. 3. Mild chronic ethmoid sinusitis.   Electronically Signed   By: Herbie BaltimoreWalt  Liebkemann M.D.   On: 02/10/2013 16:29    Scheduled Meds: . atorvastatin  80 mg Oral QHS  . enalapril  20 mg Oral Daily  . enoxaparin (LOVENOX) injection  40 mg Subcutaneous Q24H  . hydrochlorothiazide  12.5 mg Oral Daily  . ipratropium-albuterol  3 mL Nebulization Q6H WA  . levofloxacin (LEVAQUIN) IV  500 mg Intravenous Q24H  . metoprolol  100 mg Oral BID  . oseltamivir  75 mg Oral BID  . valproic acid  750 mg Oral BID   Continuous Infusions: . sodium chloride 10 mL/hr at 02/11/13 0957    Principal Problem:   CAP (community acquired pneumonia) Active Problems:   Fever    Dementia   Hypertension   Hearing loss   Seizure disorder   Other and unspecified hyperlipidemia    Time spent: 25 minutes    Hollice EspyKRISHNAN,Amarien Carne K  [facility] Pager [phone]. If 7PM-7AM, please contact night-coverage at [email or web address], password [email or web address] 02/11/2013, 7:18 PM  LOS: 1 day

## 2013-02-12 LAB — BASIC METABOLIC PANEL
BUN: 13 mg/dL (ref 6–23)
CHLORIDE: 104 meq/L (ref 96–112)
CO2: 25 meq/L (ref 19–32)
Calcium: 8.4 mg/dL (ref 8.4–10.5)
Creatinine, Ser: 0.87 mg/dL (ref 0.50–1.35)
GFR calc non Af Amer: 84 mL/min — ABNORMAL LOW (ref >90)
Glucose, Bld: 88 mg/dL (ref 70–99)
Potassium: 3.8 mEq/L (ref 3.7–5.3)
SODIUM: 140 meq/L (ref 137–147)

## 2013-02-12 MED ORDER — LEVOFLOXACIN 500 MG PO TABS
500.0000 mg | ORAL_TABLET | Freq: Every day | ORAL | Status: AC
Start: 2013-02-12 — End: 2013-02-16

## 2013-02-12 MED ORDER — ALBUTEROL SULFATE (2.5 MG/3ML) 0.083% IN NEBU: 2.5000 mg | INHALATION_SOLUTION | RESPIRATORY_TRACT | Status: DC | PRN

## 2013-02-12 MED ORDER — IPRATROPIUM-ALBUTEROL 0.5-2.5 (3) MG/3ML IN SOLN
3.0000 mL | Freq: Three times a day (TID) | RESPIRATORY_TRACT | Status: DC
  Administered 2013-02-12: 3 mL via RESPIRATORY_TRACT
  Filled 2013-02-12: qty 3

## 2013-02-12 NOTE — Progress Notes (Addendum)
Patient pulled out IV again. Will leave out per previous order by Elray McgregorMary Lynch NP.

## 2013-02-12 NOTE — Progress Notes (Signed)
Report called to [facility] at [facility].

## 2013-02-12 NOTE — Progress Notes (Addendum)
Patient had pulled IV out around 2130. Had paged NP x2 in hour to see it could leave IV out. Order to do so. Have tried since 2330 to get patient to take HS med including antibiotic without success Patient refuses to open mouth. Clenching teeth together. Restarted IV so could give antibiotic at least.

## 2013-02-12 NOTE — Discharge Summary (Signed)
Physician Discharge Summary  Arthur CrewsDavid Dunlap ZOX:[id number] DOB: [birth year] DOA: 02/10/2013  PCP: Pcp Not In System  Admit date: 02/10/2013 Discharge date: 02/12/2013  Time spent: 25 minutes   Recommendations for Outpatient Follow-up:  1. Patient being discharged back to memory care unit facility 2. Medication: Patient being discharged on Levaquin 500 milligrams for the next 5 days 3. Recommend speech therapy evaluate patient for dysphagia  Discharge Diagnoses:  Principal Problem:   CAP (community acquired pneumonia) Active Problems:   Fever   Dementia   Hypertension   Hearing loss   Seizure disorder   Other and unspecified hyperlipidemia   Discharge Condition: Improved, being discharged back to facility  Diet recommendation: Low-sodium  Filed Weights   02/10/13 2130  Weight: 87.9 kg (193 lb 12.6 oz)    History of present illness:  Patient is a 73 year old white male from the memory care unit at the nursing home with a history of hypertension and dementia who presented on 1/1, brought in because of weakness, fatigue and shortness of breath with cough. Initial chest x-ray was unremarkable and patient was started Tamiflu prophylactically for influenza as well as Levaquin, given the patient's wife is also being treated for bronchitis. Patient was admitted to the hospitalist service.  Hospital Course:  Principal Problem:   CAP (community acquired pneumonia): Patient's urinary strep pneumo antigen level came back positive. His flu PCR was negative. Tamiflu was discontinued. By 1/3, patient's vital signs are stable, breathing comfortably on room air. Initially had planned to check a swallow evaluation, the patient was adamant that he was leaving today, 1/3, and so patient can have this done at his facility.  Active Problems:   Fever: Secondary to pneumonia. Once antibiotics started, no further fevers.    Dementia: On the evening of 1/2, patient removed his IV and refused to have any  more medications. We were able to get another IV and get his second dose of IV Levaquin in. I once S3, patient stated he was absolutely going to leave today. Given his stability, or able to safely get him back to his facility.    Hypertension: Stable. Continued on his home blood pressure medications   Hearing loss   Seizure disorder: Stable. On Depakote.   Other and unspecified hyperlipidemia: Stable. Continue home meds  Procedures:  None  Consultations:  None  Discharge Exam: Filed Vitals:   02/12/13 0445  BP: 132/69  Pulse: 76  Temp: 99.6 F (37.6 C)  Resp: 16    General: Alert and oriented x1 Cardiovascular: Regular rate and rhythm, S1-S2 Respiratory: Coarse breath sounds, however much improved  Discharge Instructions  Discharge Orders   Future Orders Complete By Expires   Diet - low sodium heart healthy  As directed    Increase activity slowly  As directed        Medication List         acetaminophen 500 MG tablet  Commonly known as:  TYLENOL  Take 500 mg by mouth every 4 (four) hours as needed for fever.     alum & mag hydroxide-simeth 200-200-20 MG/5ML suspension  Commonly known as:  MAALOX/MYLANTA  Take 30 mLs by mouth every 6 (six) hours as needed for indigestion or heartburn.     atorvastatin 80 MG tablet  Commonly known as:  LIPITOR  Take 80 mg by mouth at bedtime.     enalapril 20 MG tablet  Commonly known as:  VASOTEC  Take 20 mg by mouth daily.  guaiFENesin 100 MG/5ML Soln  Commonly known as:  ROBITUSSIN  Take 10 mLs by mouth every 6 (six) hours as needed for cough or to loosen phlegm.     hydrochlorothiazide 12.5 MG tablet  Commonly known as:  HYDRODIURIL  Take 12.5 mg by mouth daily.     levofloxacin 500 MG tablet  Commonly known as:  LEVAQUIN  Take 1 tablet (500 mg total) by mouth at bedtime.     loperamide 2 MG tablet  Commonly known as:  IMODIUM A-D  Take 2 mg by mouth as needed for diarrhea or loose stools.     magnesium  hydroxide 400 MG/5ML suspension  Commonly known as:  MILK OF MAGNESIA  Take 30 mLs by mouth daily as needed for mild constipation.     Melatonin 3 MG Tabs  Take 1 tablet by mouth at bedtime.     metoprolol 100 MG tablet  Commonly known as:  LOPRESSOR  Take 100 mg by mouth 2 (two) times daily.     valproic acid 250 MG capsule  Commonly known as:  DEPAKENE  Take 750 mg by mouth 2 (two) times daily.       No Known Allergies    The results of significant diagnostics from this hospitalization (including imaging, microbiology, ancillary and laboratory) are listed below for reference.    Significant Diagnostic Studies: Dg Chest 2 View  02/10/2013   CLINICAL DATA:  Short of breath and wheezing  EXAM: CHEST  2 VIEW  COMPARISON:  None.  FINDINGS: Exam is lordotic. Normal cardiac silhouette. Low lung volumes. Bibasilar atelectasis. No effusion, infiltrate, or pneumothorax.  IMPRESSION: Low lung volumes and basilar atelectasis. No evidence of pneumonia or edema.   Electronically Signed   By: Genevive Bi M.D.   On: 02/10/2013 15:13     Ct Head Wo Contrast  02/10/2013   CLINICAL DATA:  Fatigue. Shortness of breath. Lethargy and weakness.  EXAM: CT HEAD WITHOUT CONTRAST  TECHNIQUE: Contiguous axial images were obtained from the base of the skull through the vertex without intravenous contrast.  COMPARISON:  01/15/2013  FINDINGS: The brainstem, cerebellum, cerebral peduncles, thalamus, basal ganglia, basilar cisterns, and ventricular system appear within normal limits. Periventricular white matter and corona radiata hypodensities favor chronic ischemic microvascular white matter disease. No intracranial hemorrhage, mass lesion, or acute CVA.  Mild chronic ethmoid sinusitis.  IMPRESSION: 1. No acute intracranial findings. 2. Periventricular white matter and corona radiata hypodensities favor chronic ischemic microvascular white matter disease. 3. Mild chronic ethmoid sinusitis.   Electronically  Signed   By: Herbie Baltimore M.D.   On: 02/10/2013 16:29    Microbiology: Recent Results (from the past 240 hour(s))  CULTURE, BLOOD (ROUTINE X 2)     Status: None   Collection Time    02/10/13  1:55 PM      Result Value Range Status   Specimen Description BLOOD RIGHT ARM  5 ML IN [facility] BOTTLE   Final   Special Requests NONE   Final   Culture  Setup Time     Final   Value: 02/10/2013 22:11     Performed at [facility]   Culture     Final   Value:        BLOOD CULTURE RECEIVED NO GROWTH TO DATE CULTURE WILL BE HELD FOR 5 DAYS BEFORE ISSUING A FINAL NEGATIVE REPORT     Performed at [facility]   Report Status PENDING   Incomplete  CULTURE, BLOOD (ROUTINE  X 2)     Status: None   Collection Time    02/10/13  2:12 PM      Result Value Range Status   Specimen Description BLOOD LEFT ANTECUBITAL   Final   Special Requests BOTTLES DRAWN AEROBIC AND ANAEROBIC 4.5CC   Final   Culture  Setup Time     Final   Value: 02/10/2013 22:11     Performed at [facility]   Culture     Final   Value:        BLOOD CULTURE RECEIVED NO GROWTH TO DATE CULTURE WILL BE HELD FOR 5 DAYS BEFORE ISSUING A FINAL NEGATIVE REPORT     Performed at [facility]   Report Status PENDING   Incomplete  MRSA PCR SCREENING     Status: None   Collection Time    02/10/13  9:45 PM      Result Value Range Status   MRSA by PCR NEGATIVE  NEGATIVE Final   Comment:            The GeneXpert MRSA Assay (FDA     approved for NASAL specimens     only), is one component of a     comprehensive MRSA colonization     surveillance program. It is not     intended to diagnose MRSA     infection nor to guide or     monitor treatment for     MRSA infections.     Labs: Basic Metabolic Panel:  Recent Labs Lab 02/10/13 1355 02/12/13 0440  NA 132* 140  K 3.6* 3.8  CL 95* 104  CO2 21 25  GLUCOSE 132* 88  BUN 14 13  CREATININE 0.78 0.87  CALCIUM 9.0 8.4   Liver Function Tests:  Recent  Labs Lab 02/10/13 1355  AST 16  ALT 14  ALKPHOS 66  BILITOT 0.4  PROT 7.0  ALBUMIN 3.6   No results found for this basename: LIPASE, AMYLASE,  in the last 168 hours No results found for this basename: AMMONIA,  in the last 168 hours CBC:  Recent Labs Lab 02/10/13 1355  WBC 8.0  NEUTROABS 7.1  HGB 13.7  HCT 40.3  MCV 88.2  PLT 162   Cardiac Enzymes:  Recent Labs Lab 02/10/13 1550  TROPONINI <0.30   BNP: BNP (last 3 results) No results found for this basename: PROBNP,  in the last 8760 hours CBG: No results found for this basename: GLUCAP,  in the last 168 hours     Signed:  Hollice Espy  [facility] 02/12/2013, 10:47 AM

## 2013-02-12 NOTE — Progress Notes (Signed)
Flu negative, d/c'd droplet precautions.

## 2013-02-12 NOTE — Progress Notes (Signed)
Per MD, Pt ready for d/c.  Notified RN, Pt, family and facility.  Sent d/c summary.  Confirmed receipt of d/c summary.  Facility ready to receive Pt.  Arranged for transportation.  Amanda Cece Milhouse, LCSWA Clinical Social Work [phone]   

## 2013-02-13 LAB — URINE CULTURE
Colony Count: NO GROWTH
Culture: NO GROWTH

## 2013-02-16 LAB — CULTURE, BLOOD (ROUTINE X 2)
Culture: NO GROWTH
Culture: NO GROWTH

## 2013-02-17 NOTE — ED Provider Notes (Signed)
CSN: [id number]     Arrival date & time 02/10/13  1342 History   First MD Initiated Contact with Patient 02/10/13 1500     Chief Complaint  Patient presents with  . Fatigue  . weakness   . Shortness of Breath   (Consider location/radiation/quality/duration/timing/severity/associated sxs/prior Treatment) HPI  Arthur Dunlap is a 73 y.o. male, nursing home resident with history of dementia and hypertension who was brought here because of fatigue weakness shortness of breath. Patient is not a very good historian. She has a history dementia. Normally he is quite ambulatory but recently he has been laying in bed. Exact timeline of this change is unclear. She has no specific complaints. He does appear tired though. He is occasionally coughing. He denies any pain to me for what it's worth. Apparently many residents with recent illness. No reported trauma.  Past Medical History  Diagnosis Date  . Dementia   . Depression   . Hypertension   . Foot drop   . Hearing loss    Past Surgical History  Procedure Laterality Date  . No past surgeries     History reviewed. No pertinent family history. History  Substance Use Topics  . Smoking status: Former Games developer  . 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 Alcohol Use: No    Review of Systems  Level V caveat because patient has dementia and is an unreliable historian.  Allergies  Review of patient's allergies indicates no known allergies.  Home Medications   Current Outpatient Rx  Name  Route  Sig  Dispense  Refill  . atorvastatin (LIPITOR) 80 MG tablet   Oral   Take 80 mg by mouth at bedtime.          . enalapril (VASOTEC) 20 MG tablet   Oral   Take 20 mg by mouth daily.         . hydrochlorothiazide (HYDRODIURIL) 12.5 MG tablet   Oral   Take 12.5 mg by mouth daily.         . Melatonin 3 MG TABS   Oral   Take 1 tablet by mouth at bedtime.         . metoprolol (LOPRESSOR) 100 MG  tablet   Oral   Take 100 mg by mouth 2 (two) times daily.         Marland Kitchen valproic acid (DEPAKENE) 250 MG capsule   Oral   Take 750 mg by mouth 2 (two) times daily.         Marland Kitchen acetaminophen (TYLENOL) 500 MG tablet   Oral   Take 500 mg by mouth every 4 (four) hours as needed for fever.         Marland Kitchen alum & mag hydroxide-simeth (MAALOX/MYLANTA) 200-200-20 MG/5ML suspension   Oral   Take 30 mLs by mouth every 6 (six) hours as needed for indigestion or heartburn.         Marland Kitchen guaiFENesin (ROBITUSSIN) 100 MG/5ML SOLN   Oral   Take 10 mLs by mouth every 6 (six) hours as needed for cough or to loosen phlegm.         . loperamide (IMODIUM A-D) 2 MG tablet   Oral   Take 2 mg by mouth as needed for diarrhea or loose stools.         . magnesium hydroxide (MILK OF MAGNESIA) 400 MG/5ML suspension   Oral   Take 30 mLs by mouth daily as needed  for mild constipation.          BP 132/69  Pulse 76  Temp(Src) 99.6 F (37.6 C) (Oral)  Resp 16  Ht 5\' 8"  (1.727 m)  Wt 193 lb 12.6 oz (87.9 kg)  SpO2 95% Physical Exam  Nursing note and vitals reviewed. Constitutional: He appears well-developed and well-nourished. No distress.  HENT:  Head: Normocephalic and atraumatic.  Eyes: Conjunctivae are normal. Right eye exhibits no discharge. Left eye exhibits no discharge.  Neck: Neck supple.  Cardiovascular: Regular rhythm and normal heart sounds.  Exam reveals no gallop and no friction rub.   No murmur heard. Mild tachycardia  Pulmonary/Chest: Effort normal and breath sounds normal. No respiratory distress.  Abdominal: Soft. He exhibits no distension. There is no tenderness.  Musculoskeletal: He exhibits no edema and no tenderness.  Neurological:  Drowsy, but opens eyes to voice. Answers some simple questions although some answers inappropriate. Does follow simple one-step commands. Hard of hearing. No focal motor deficit noted. No facial droop.  Skin: Skin is warm and dry.  Psychiatric: He has  a normal mood and affect. His behavior is normal. Thought content normal.    ED Course  Procedures (including critical care time) Labs Review Labs Reviewed  CBC WITH DIFFERENTIAL - Abnormal; Notable for the following:    Neutrophils Relative % 89 (*)    Lymphocytes Relative 4 (*)    Lymphs Abs 0.3 (*)    All other components within normal limits  COMPREHENSIVE METABOLIC PANEL - Abnormal; Notable for the following:    Sodium 132 (*)    Potassium 3.6 (*)    Chloride 95 (*)    Glucose, Bld 132 (*)    GFR calc non Af Amer 88 (*)    All other components within normal limits  BLOOD GAS, ARTERIAL - Abnormal; Notable for the following:    pH, Arterial 7.490 (*)    pCO2 arterial 31.0 (*)    pO2, Arterial 65.2 (*)    All other components within normal limits  STREP PNEUMONIAE URINARY ANTIGEN - Abnormal; Notable for the following:    Strep Pneumo Urinary Antigen POSITIVE (*)    All other components within normal limits  BASIC METABOLIC PANEL - Abnormal; Notable for the following:    GFR calc non Af Amer 84 (*)    All other components within normal limits  CULTURE, BLOOD (ROUTINE X 2)  CULTURE, BLOOD (ROUTINE X 2)  URINE CULTURE  MRSA PCR SCREENING  CULTURE, EXPECTORATED SPUTUM-ASSESSMENT  URINALYSIS, ROUTINE W REFLEX MICROSCOPIC  TROPONIN I  INFLUENZA PANEL BY PCR  LEGIONELLA ANTIGEN, URINE   Imaging Review No results found.  EKG Interpretation    Date/Time:  Thursday February 10 2013 16:02:39 EST Ventricular Rate:  104 PR Interval:  181 QRS Duration: 101 QT Interval:  372 QTC Calculation: 489 R Axis:   -9 Text Interpretation:  Sinus tachycardia Borderline low voltage, extremity leads Borderline prolonged QT interval ED PHYSICIAN INTERPRETATION AVAILABLE IN [facility] Confirmed by TEST, RECORD ([id number]) on 02/12/2013 2:19:03 PM            MDM   1. Fever   2. Dementia   3. Hearing loss, unspecified laterality   4. Hypertension   5. CAP (community acquired  pneumonia)   6. Seizure disorder    73 year old male with fever and change in his mental status. Consider pneumonia with no cough. Consider flu. Empirically treated at this time. Given his age and change in mental status will admit  for further treatment and evaluation.    Raeford Razor, MD 02/17/13 0900

## 2013-03-27 ENCOUNTER — Encounter (HOSPITAL_COMMUNITY): Payer: Self-pay | Admitting: Emergency Medicine

## 2013-03-27 ENCOUNTER — Emergency Department (HOSPITAL_COMMUNITY)
Admission: EM | Admit: 2013-03-27 | Discharge: 2013-03-27 | Disposition: A | Discharge status: Skilled Nursing Facility | Payer: Medicare Other | Attending: Emergency Medicine | Admitting: Emergency Medicine

## 2013-03-27 DIAGNOSIS — W19XXXA Unspecified fall, initial encounter: Secondary | ICD-10-CM

## 2013-03-27 DIAGNOSIS — Z8669 Personal history of other diseases of the nervous system and sense organs: Secondary | ICD-10-CM | POA: Insufficient documentation

## 2013-03-27 DIAGNOSIS — Z043 Encounter for examination and observation following other accident: Secondary | ICD-10-CM | POA: Insufficient documentation

## 2013-03-27 DIAGNOSIS — W07XXXA Fall from chair, initial encounter: Secondary | ICD-10-CM | POA: Insufficient documentation

## 2013-03-27 DIAGNOSIS — Z79899 Other long term (current) drug therapy: Secondary | ICD-10-CM | POA: Insufficient documentation

## 2013-03-27 DIAGNOSIS — Z8739 Personal history of other diseases of the musculoskeletal system and connective tissue: Secondary | ICD-10-CM | POA: Insufficient documentation

## 2013-03-27 DIAGNOSIS — F0390 Unspecified dementia without behavioral disturbance: Secondary | ICD-10-CM | POA: Insufficient documentation

## 2013-03-27 DIAGNOSIS — Y929 Unspecified place or not applicable: Secondary | ICD-10-CM | POA: Insufficient documentation

## 2013-03-27 DIAGNOSIS — Z87891 Personal history of nicotine dependence: Secondary | ICD-10-CM | POA: Insufficient documentation

## 2013-03-27 DIAGNOSIS — I10 Essential (primary) hypertension: Secondary | ICD-10-CM | POA: Insufficient documentation

## 2013-03-27 DIAGNOSIS — Y9389 Activity, other specified: Secondary | ICD-10-CM | POA: Insufficient documentation

## 2013-03-27 NOTE — ED Notes (Addendum)
Pt denies pain or injury. No bruising or lacerations to face.

## 2013-03-27 NOTE — Discharge Instructions (Signed)

## 2013-03-27 NOTE — ED Provider Notes (Signed)
CSN: [id number]     Arrival date & time 03/27/13  [id number] History   First MD Initiated Contact with Patient 03/27/13 [phone]     Chief Complaint  Patient presents with  . Fall   Level V caveat due to dementia.   (Consider location/radiation/quality/duration/timing/severity/associated sxs/prior Treatment) Patient is a 73 y.o. male presenting with fall. The history is provided by the patient, the EMS personnel and the nursing home.  Fall   patient reportedly had a fall time to get out of chair. He is reportedly supposed to use a walker but was not using it. Patient has dementia, however he is without complaints. No loss of consciousness reported. He is at his demented baseline. Past Medical History  Diagnosis Date  . Dementia   . Depression   . Hypertension   . Foot drop   . Hearing loss    Past Surgical History  Procedure Laterality Date  . No past surgeries     History reviewed. No pertinent family history. History  Substance Use Topics  . Smoking status: Former Games developermoker  . 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 Alcohol Use: No    Review of Systems  Unable to perform ROS     Allergies  Review of patient's allergies indicates no known allergies.  Home Medications   Current Outpatient Rx  Name  Route  Sig  Dispense  Refill  . acetaminophen (TYLENOL) 500 MG tablet   Oral   Take 500 mg by mouth every 4 (four) hours as needed for fever.         Marland Kitchen. alum & mag hydroxide-simeth (MAALOX/MYLANTA) 200-200-20 MG/5ML suspension   Oral   Take 30 mLs by mouth every 6 (six) hours as needed for indigestion or heartburn.         Marland Kitchen. atorvastatin (LIPITOR) 80 MG tablet   Oral   Take 80 mg by mouth at bedtime.          . enalapril (VASOTEC) 20 MG tablet   Oral   Take 20 mg by mouth daily.         Marland Kitchen. guaiFENesin (ROBITUSSIN) 100 MG/5ML SOLN   Oral   Take 10 mLs by mouth every 6 (six) hours as needed for cough or to loosen phlegm.         . haloperidol (HALDOL) 1 MG tablet   Oral   Take 1 mg by mouth every 4 (four) hours as needed for agitation.         . hydrochlorothiazide (HYDRODIURIL) 12.5 MG tablet   Oral   Take 12.5 mg by mouth daily.         Marland Kitchen. loperamide (IMODIUM A-D) 2 MG tablet   Oral   Take 2 mg by mouth as needed for diarrhea or loose stools.         Marland Kitchen. LORazepam (ATIVAN) 0.5 MG tablet   Oral   Take 0.5 mg by mouth 2 (two) times daily.         . magnesium hydroxide (MILK OF MAGNESIA) 400 MG/5ML suspension   Oral   Take 30 mLs by mouth daily as needed for mild constipation.         . Melatonin 3 MG TABS   Oral   Take 1 tablet by mouth at bedtime.         . metoprolol (LOPRESSOR) 100 MG tablet   Oral   Take 100 mg by mouth 2 (two)  times daily.         Marland Kitchen valproic acid (DEPAKENE) 250 MG capsule   Oral   Take 500 mg by mouth 3 (three) times daily.           BP 144/67  Temp(Src) 97.8 F (36.6 C) (Oral)  Resp 18  SpO2 96% Physical Exam  Constitutional: He appears well-developed and well-nourished.  HENT:  Head: Normocephalic and atraumatic.  Eyes: Pupils are equal, round, and reactive to light.  Neck: Normal range of motion. Neck supple.  Cardiovascular: Normal rate and regular rhythm.   Pulmonary/Chest: Effort normal and breath sounds normal.  Abdominal: Soft. There is no tenderness.  Musculoskeletal: He exhibits no edema.  Neurological: He is alert.  At baseline dementia.  Skin: Skin is warm.    ED Course  Procedures (including critical care time) Labs Review Labs Reviewed - No data to display Imaging Review No results found.  EKG Interpretation   None       MDM   Final diagnoses:  Fall    Patient with fall. No apparent trauma. No tenderness. He is at his baseline. He is not on anticoagulation. Will discharge back to facility    [facility]. Rubin Payor, MD 03/27/13 [phone]

## 2013-03-27 NOTE — ED Notes (Signed)
Bed: [id number] Expected date: 03/27/13 Expected time: 8:48 AM Means of arrival: Ambulance Comments: Fall

## 2013-03-27 NOTE — ED Notes (Signed)
Pt able to ambulate with one assist. 

## 2013-03-27 NOTE — ED Notes (Signed)
Per EMS[facility]. Per staff pt supposed to use walker/wheelchair, but pt forgot while trying to get up out of dining room chair. Pt fell forward. No LOC, denies pain. Staff told EMS pt was acting per norm.

## 2013-04-07 ENCOUNTER — Emergency Department (HOSPITAL_COMMUNITY)
Admission: EM | Admit: 2013-04-07 | Discharge: 2013-04-07 | Disposition: A | Discharge status: Home / Self Care | Payer: Medicare Other | Attending: Emergency Medicine | Admitting: Emergency Medicine

## 2013-04-07 ENCOUNTER — Encounter (HOSPITAL_COMMUNITY): Payer: Self-pay | Admitting: Emergency Medicine

## 2013-04-07 DIAGNOSIS — Y9210 Unspecified residential institution as the place of occurrence of the external cause: Secondary | ICD-10-CM | POA: Insufficient documentation

## 2013-04-07 DIAGNOSIS — Y939 Activity, unspecified: Secondary | ICD-10-CM | POA: Insufficient documentation

## 2013-04-07 DIAGNOSIS — Z043 Encounter for examination and observation following other accident: Secondary | ICD-10-CM | POA: Insufficient documentation

## 2013-04-07 DIAGNOSIS — F0390 Unspecified dementia without behavioral disturbance: Secondary | ICD-10-CM | POA: Insufficient documentation

## 2013-04-07 DIAGNOSIS — Z79899 Other long term (current) drug therapy: Secondary | ICD-10-CM | POA: Insufficient documentation

## 2013-04-07 DIAGNOSIS — W1809XA Striking against other object with subsequent fall, initial encounter: Secondary | ICD-10-CM | POA: Insufficient documentation

## 2013-04-07 DIAGNOSIS — Z8669 Personal history of other diseases of the nervous system and sense organs: Secondary | ICD-10-CM | POA: Insufficient documentation

## 2013-04-07 DIAGNOSIS — W19XXXA Unspecified fall, initial encounter: Secondary | ICD-10-CM

## 2013-04-07 DIAGNOSIS — Z8739 Personal history of other diseases of the musculoskeletal system and connective tissue: Secondary | ICD-10-CM | POA: Insufficient documentation

## 2013-04-07 DIAGNOSIS — Z87891 Personal history of nicotine dependence: Secondary | ICD-10-CM | POA: Insufficient documentation

## 2013-04-07 DIAGNOSIS — I10 Essential (primary) hypertension: Secondary | ICD-10-CM | POA: Insufficient documentation

## 2013-04-07 NOTE — ED Notes (Signed)
Bed: WA17 Expected date:  Expected time:  Means of arrival:  Comments: EMS fall 

## 2013-04-07 NOTE — ED Notes (Signed)
Per EMS, Pt tries to walk but is not supposed to. Pt stood up and fell straight back and hit the back of his head. No LOC, no bleeding. Pt has hx of alzheimers. Pt did not act like his head hurt upon palpation. Pt has red spot on the back of his head. Pt oriented to baseline.

## 2013-04-07 NOTE — ED Notes (Signed)
[facility] arrived for patient transport. Report given to [facility].

## 2013-04-07 NOTE — ED Provider Notes (Signed)
CSN: [id number]     Arrival date & time 04/07/13  1857 History   First MD Initiated Contact with Patient 04/07/13 1900     Chief Complaint  Patient presents with  . Fall      HPI  Patient presents after a witnessed fall.  According to nursing home staff the patient stood, fell backwards striking his head.  No report of loss of consciousness, or any change in behavior, vital signs. The patient himself denies any pain, complaints, states that he feels well. He has dementia.  Level V caveat.   Past Medical History  Diagnosis Date  . Dementia   . Depression   . Hypertension   . Foot drop   . Hearing loss    Past Surgical History  Procedure Laterality Date  . No past surgeries     No family history on file. History  Substance Use Topics  . Smoking status: Former Games developermoker  . 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 Alcohol Use: No    Review of Systems  Unable to perform ROS: Dementia      Allergies  Review of patient's allergies indicates no known allergies.  Home Medications   Current Outpatient Rx  Name  Route  Sig  Dispense  Refill  . acetaminophen (TYLENOL) 500 MG tablet   Oral   Take 500 mg by mouth every 4 (four) hours as needed for fever.         Marland Kitchen. alum & mag hydroxide-simeth (MAALOX/MYLANTA) 200-200-20 MG/5ML suspension   Oral   Take 30 mLs by mouth every 6 (six) hours as needed for indigestion or heartburn.         Marland Kitchen. atorvastatin (LIPITOR) 80 MG tablet   Oral   Take 80 mg by mouth at bedtime.          . enalapril (VASOTEC) 20 MG tablet   Oral   Take 20 mg by mouth daily.         Marland Kitchen. guaiFENesin (ROBITUSSIN) 100 MG/5ML SOLN   Oral   Take 10 mLs by mouth every 6 (six) hours as needed for cough or to loosen phlegm.         . haloperidol (HALDOL) 1 MG tablet   Oral   Take 1 mg by mouth every 4 (four) hours as needed for agitation.         . hydrochlorothiazide (HYDRODIURIL) 12.5 MG tablet   Oral  Take 12.5 mg by mouth daily.         Marland Kitchen. loperamide (IMODIUM A-D) 2 MG tablet   Oral   Take 2 mg by mouth as needed for diarrhea or loose stools.         Marland Kitchen. LORazepam (ATIVAN) 0.5 MG tablet   Oral   Take 0.5 mg by mouth 2 (two) times daily.         . magnesium hydroxide (MILK OF MAGNESIA) 400 MG/5ML suspension   Oral   Take 30 mLs by mouth daily as needed for mild constipation.         . Melatonin 3 MG TABS   Oral   Take 1 tablet by mouth at bedtime.         . metoprolol (LOPRESSOR) 100 MG tablet   Oral   Take 100 mg by mouth 2 (two) times daily.         Marland Kitchen. valproic acid (DEPAKENE) 250 MG capsule   Oral  Take 500 mg by mouth 3 (three) times daily.           BP 155/73  Pulse 78  Temp(Src) 98.1 F (36.7 C) (Oral)  Resp 16  SpO2 97% Physical Exam  Nursing note and vitals reviewed. Constitutional: He is oriented to person, place, and time. He appears well-developed. No distress.  HENT:  Head: Normocephalic and atraumatic.  No appreciable deformity, no bony step-off, no lesions, no bleeding  Eyes: Conjunctivae and EOM are normal.  Neck:  Patient has full range of motion, no tenderness to palpation, neck is supple, with no deformities  Cardiovascular: Normal rate and regular rhythm.   Pulmonary/Chest: Effort normal. No stridor. No respiratory distress.  Abdominal: He exhibits no distension.  Musculoskeletal: He exhibits no edema.  Neurological: He is alert and oriented to person, place, and time. No cranial nerve deficit. He exhibits normal muscle tone. Coordination normal.  Skin: Skin is warm and dry.  Psychiatric: He has a normal mood and affect. His speech is delayed. He is withdrawn. Cognition and memory are impaired.    ED Course  Procedures (including critical care time)   After the initial evaluation I reviewed the patient's chart, including the recent presentation for fall.   MDM   Final diagnoses:  Fall    This elderly male with dementia  presents from his nursing facility after a mechanical fall.  Patient is not on blood thinning medication, has no discernible evidence of trauma, has no complaints, no neurologic deficits.  Imaging is not indicated.  Patient was discharged in stable condition.    Gerhard Munch, MD 04/07/13 [phone]

## 2013-04-09 ENCOUNTER — Encounter (HOSPITAL_COMMUNITY): Payer: Self-pay | Admitting: Emergency Medicine

## 2013-04-09 ENCOUNTER — Emergency Department (HOSPITAL_COMMUNITY)
Admission: EM | Admit: 2013-04-09 | Discharge: 2013-04-09 | Disposition: A | Discharge status: Home / Self Care | Payer: Medicare Other | Attending: Emergency Medicine | Admitting: Emergency Medicine

## 2013-04-09 ENCOUNTER — Emergency Department (HOSPITAL_COMMUNITY): Payer: Medicare Other

## 2013-04-09 DIAGNOSIS — F0390 Unspecified dementia without behavioral disturbance: Secondary | ICD-10-CM | POA: Insufficient documentation

## 2013-04-09 DIAGNOSIS — Z87891 Personal history of nicotine dependence: Secondary | ICD-10-CM | POA: Insufficient documentation

## 2013-04-09 DIAGNOSIS — I10 Essential (primary) hypertension: Secondary | ICD-10-CM | POA: Insufficient documentation

## 2013-04-09 DIAGNOSIS — F3289 Other specified depressive episodes: Secondary | ICD-10-CM | POA: Insufficient documentation

## 2013-04-09 DIAGNOSIS — Y939 Activity, unspecified: Secondary | ICD-10-CM | POA: Insufficient documentation

## 2013-04-09 DIAGNOSIS — Y929 Unspecified place or not applicable: Secondary | ICD-10-CM | POA: Insufficient documentation

## 2013-04-09 DIAGNOSIS — Z792 Long term (current) use of antibiotics: Secondary | ICD-10-CM | POA: Insufficient documentation

## 2013-04-09 DIAGNOSIS — W19XXXA Unspecified fall, initial encounter: Secondary | ICD-10-CM

## 2013-04-09 DIAGNOSIS — F329 Major depressive disorder, single episode, unspecified: Secondary | ICD-10-CM | POA: Insufficient documentation

## 2013-04-09 DIAGNOSIS — Z043 Encounter for examination and observation following other accident: Secondary | ICD-10-CM | POA: Insufficient documentation

## 2013-04-09 DIAGNOSIS — Z8739 Personal history of other diseases of the musculoskeletal system and connective tissue: Secondary | ICD-10-CM | POA: Insufficient documentation

## 2013-04-09 DIAGNOSIS — Z79899 Other long term (current) drug therapy: Secondary | ICD-10-CM | POA: Insufficient documentation

## 2013-04-09 DIAGNOSIS — H9190 Unspecified hearing loss, unspecified ear: Secondary | ICD-10-CM | POA: Insufficient documentation

## 2013-04-09 DIAGNOSIS — W1809XA Striking against other object with subsequent fall, initial encounter: Secondary | ICD-10-CM | POA: Insufficient documentation

## 2013-04-09 NOTE — ED Notes (Signed)
[facility]. Patient had a fall that was witnessed by staff. Patient hit his head on table leg but unable to tell if it was left or right side of head. No LOC after fall. Patient following commands has hx of dementia.

## 2013-04-09 NOTE — ED Notes (Signed)
Patient unable to sign D/C papers

## 2013-04-09 NOTE — Discharge Instructions (Signed)

## 2013-04-09 NOTE — ED Provider Notes (Signed)
CSN: [id number]     Arrival date & time 04/09/13  1906 History   First MD Initiated Contact with Patient 04/09/13 1919     Chief Complaint  Patient presents with  . Fall     (Consider location/radiation/quality/duration/timing/severity/associated sxs/prior Treatment) Patient is a 73 y.o. male presenting with fall. The history is provided by the patient, the EMS personnel and the nursing home.  Fall This is a recurrent problem.   patient with reported witnessed fall. He reportedly hit his head. His unknown which side. Patient is too demented to me a history. He is moving all extremities and the symptoms TV.  Past Medical History  Diagnosis Date  . Dementia   . Depression   . Hypertension   . Foot drop   . Hearing loss    Past Surgical History  Procedure Laterality Date  . No past surgeries     History reviewed. No pertinent family history. History  Substance Use Topics  . Smoking status: Former Games developer  . 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 Alcohol Use: No    Review of Systems  Unable to perform ROS     Allergies  Review of patient's allergies indicates no known allergies.  Home Medications   Current Outpatient Rx  Name  Route  Sig  Dispense  Refill  . acetaminophen (TYLENOL) 500 MG tablet   Oral   Take 500 mg by mouth every 4 (four) hours as needed for mild pain, fever or headache.          Marland Kitchen alum & mag hydroxide-simeth (MAALOX/MYLANTA) 200-200-20 MG/5ML suspension   Oral   Take 30 mLs by mouth every 6 (six) hours as needed for indigestion or heartburn.         Marland Kitchen amoxicillin (AMOXIL) 875 MG tablet   Oral   Take 875 mg by mouth 2 (two) times daily. For 10 days         . atorvastatin (LIPITOR) 80 MG tablet   Oral   Take 80 mg by mouth at bedtime.          . enalapril (VASOTEC) 20 MG tablet   Oral   Take 20 mg by mouth every morning.          Marland Kitchen guaiFENesin (ROBITUSSIN) 100 MG/5ML SOLN   Oral   Take 10  mLs by mouth every 6 (six) hours as needed for cough or to loosen phlegm.         . haloperidol (HALDOL) 1 MG tablet   Oral   Take 1 mg by mouth every 4 (four) hours as needed for agitation.         . hydrochlorothiazide (HYDRODIURIL) 12.5 MG tablet   Oral   Take 12.5 mg by mouth every morning.          . loperamide (IMODIUM A-D) 2 MG tablet   Oral   Take 2 mg by mouth as needed for diarrhea or loose stools.         Marland Kitchen LORazepam (ATIVAN) 0.5 MG tablet   Oral   Take 0.5 mg by mouth 2 (two) times daily.         . magnesium hydroxide (MILK OF MAGNESIA) 400 MG/5ML suspension   Oral   Take 30 mLs by mouth daily as needed for mild constipation.         . Melatonin 3 MG TABS   Oral   Take 1 tablet by  mouth at bedtime.         . metoprolol (LOPRESSOR) 100 MG tablet   Oral   Take 100 mg by mouth 2 (two) times daily.         Marland Kitchen. valproic acid (DEPAKENE) 250 MG capsule   Oral   Take 500 mg by mouth 3 (three) times daily.           BP 155/70  Pulse 66  Temp(Src) 97.9 F (36.6 C) (Oral)  Resp 16  SpO2 96% Physical Exam  Nursing note and vitals reviewed. Constitutional: He appears well-developed and well-nourished.  HENT:  Head: Normocephalic and atraumatic.  Eyes: EOM are normal. Pupils are equal, round, and reactive to light.  Neck: Normal range of motion. Neck supple.  Cardiovascular: Normal rate, regular rhythm and normal heart sounds.   No murmur heard. Pulmonary/Chest: Effort normal and breath sounds normal.  Abdominal: Soft. Bowel sounds are normal. He exhibits no distension and no mass. There is no tenderness. There is no rebound and no guarding.  Musculoskeletal: Normal range of motion. He exhibits no edema.  Neurological: He is alert. No cranial nerve deficit.  Patient with baseline dementia.  Skin: Skin is warm and dry.  Psychiatric: He has a normal mood and affect.    ED Course  Procedures (including critical care time) Labs Review Labs  Reviewed - No data to display Imaging Review No results found.   EKG Interpretation None      MDM   Final diagnoses:  None    Patient with recurrent falls. He reportedly hit his head, but unable to tell me. No evidence of trauma on head, however with frequent falls and reportedly hitting his head multiple times got a CT, which was negative. Will discharge back to the nursing home.    Juliet RudeNathan R. Rubin PayorPickering, MD 04/09/13 [phone]

## 2013-04-10 ENCOUNTER — Emergency Department (HOSPITAL_COMMUNITY)
Admission: EM | Admit: 2013-04-10 | Discharge: 2013-04-10 | Disposition: A | Discharge status: Home / Self Care | Payer: Medicare Other | Attending: Emergency Medicine | Admitting: Emergency Medicine

## 2013-04-10 ENCOUNTER — Encounter (HOSPITAL_COMMUNITY): Payer: Self-pay | Admitting: Emergency Medicine

## 2013-04-10 DIAGNOSIS — Z043 Encounter for examination and observation following other accident: Secondary | ICD-10-CM | POA: Insufficient documentation

## 2013-04-10 DIAGNOSIS — F0390 Unspecified dementia without behavioral disturbance: Secondary | ICD-10-CM | POA: Insufficient documentation

## 2013-04-10 DIAGNOSIS — Z87891 Personal history of nicotine dependence: Secondary | ICD-10-CM | POA: Insufficient documentation

## 2013-04-10 DIAGNOSIS — Z8669 Personal history of other diseases of the nervous system and sense organs: Secondary | ICD-10-CM | POA: Insufficient documentation

## 2013-04-10 DIAGNOSIS — Z79899 Other long term (current) drug therapy: Secondary | ICD-10-CM | POA: Insufficient documentation

## 2013-04-10 DIAGNOSIS — R296 Repeated falls: Secondary | ICD-10-CM

## 2013-04-10 DIAGNOSIS — I10 Essential (primary) hypertension: Secondary | ICD-10-CM | POA: Insufficient documentation

## 2013-04-10 DIAGNOSIS — Z9181 History of falling: Secondary | ICD-10-CM | POA: Insufficient documentation

## 2013-04-10 DIAGNOSIS — Z8739 Personal history of other diseases of the musculoskeletal system and connective tissue: Secondary | ICD-10-CM | POA: Insufficient documentation

## 2013-04-10 NOTE — ED Notes (Signed)
He is sitting up eating breakfast; and remains in no distress.

## 2013-04-10 NOTE — ED Notes (Signed)
[facility]

## 2013-04-10 NOTE — Discharge Instructions (Signed)
Return to the ER for any increased confusion, headache.  Fall Prevention and Home Safety Falls cause injuries and can affect all age groups. It is possible to use preventive measures to significantly decrease the likelihood of falls. There are many simple measures which can make your home safer and prevent falls. OUTDOORS  Repair cracks and edges of walkways and driveways.  Remove high doorway thresholds.  Trim shrubbery on the main path into your home.  Have good outside lighting.  Clear walkways of tools, rocks, debris, and clutter.  Check that handrails are not broken and are securely fastened. Both sides of steps should have handrails.  Have leaves, snow, and ice cleared regularly.  Use sand or salt on walkways during winter months.  In the garage, clean up grease or oil spills. BATHROOM  Install night lights.  Install grab bars by the toilet and in the tub and shower.  Use non-skid mats or decals in the tub or shower.  Place a plastic non-slip stool in the shower to sit on, if needed.  Keep floors dry and clean up all water on the floor immediately.  Remove soap buildup in the tub or shower on a regular basis.  Secure bath mats with non-slip, double-sided rug tape.  Remove throw rugs and tripping hazards from the floors. BEDROOMS  Install night lights.  Make sure a bedside light is easy to reach.  Do not use oversized bedding.  Keep a telephone by your bedside.  Have a firm chair with side arms to use for getting dressed.  Remove throw rugs and tripping hazards from the floor. KITCHEN  Keep handles on pots and pans turned toward the center of the stove. Use back burners when possible.  Clean up spills quickly and allow time for drying.  Avoid walking on wet floors.  Avoid hot utensils and knives.  Position shelves so they are not too high or low.  Place commonly used objects within easy reach.  If necessary, use a sturdy step stool with a grab  bar when reaching.  Keep electrical cables out of the way.  Do not use floor polish or wax that makes floors slippery. If you must use wax, use non-skid floor wax.  Remove throw rugs and tripping hazards from the floor. STAIRWAYS  Never leave objects on stairs.  Place handrails on both sides of stairways and use them. Fix any loose handrails. Make sure handrails on both sides of the stairways are as long as the stairs.  Check carpeting to make sure it is firmly attached along stairs. Make repairs to worn or loose carpet promptly.  Avoid placing throw rugs at the top or bottom of stairways, or properly secure the rug with carpet tape to prevent slippage. Get rid of throw rugs, if possible.  Have an electrician put in a light switch at the top and bottom of the stairs. OTHER FALL PREVENTION TIPS  Wear low-heel or rubber-soled shoes that are supportive and fit well. Wear closed toe shoes.  When using a stepladder, make sure it is fully opened and both spreaders are firmly locked. Do not climb a closed stepladder.  Add color or contrast paint or tape to grab bars and handrails in your home. Place contrasting color strips on first and last steps.  Learn and use mobility aids as needed. Install an electrical emergency response system.  Turn on lights to avoid dark areas. Replace light bulbs that burn out immediately. Get light switches that glow.  Arrange furniture  to create clear pathways. Keep furniture in the same place.  Firmly attach carpet with non-skid or double-sided tape.  Eliminate uneven floor surfaces.  Select a carpet pattern that does not visually hide the edge of steps.  Be aware of all pets. OTHER HOME SAFETY TIPS  Set the water temperature for 120 F (48.8 C).  Keep emergency numbers on or near the telephone.  Keep smoke detectors on every level of the home and near sleeping areas. Document Released: 01/17/2002 Document Revised: 07/29/2011 Document Reviewed:  04/18/2011 [facility] Patient Information 2014 [employer] [place] [place].

## 2013-04-10 NOTE — ED Provider Notes (Signed)
CSN: [id number]     Arrival date & time 04/10/13  0645 History   First MD Initiated Contact with Patient 04/10/13 [phone]     Chief Complaint  Patient presents with  . Fall     (Consider location/radiation/quality/duration/timing/severity/associated sxs/prior Treatment) HPI Comments: Patient presents to the ER for evaluation after a fall. Patient has a history of dementia and has had multiple falls. Patient was seen yesterday for a fall, returned to the nursing facility. He apparently fell once again this morning. Staff reports that he did hit the right side of his head. He was ambulatory after the fall. Level V Caveat due to dementia.  Patient is a 73 y.o. male presenting with fall.  Fall    Past Medical History  Diagnosis Date  . Dementia   . Depression   . Hypertension   . Foot drop   . Hearing loss    Past Surgical History  Procedure Laterality Date  . No past surgeries     History reviewed. No pertinent family history. History  Substance Use Topics  . Smoking status: Former Games developermoker  . 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 Alcohol Use: No    Review of Systems  Unable to perform ROS: Dementia      Allergies  Review of patient's allergies indicates no known allergies.  Home Medications   Current Outpatient Rx  Name  Route  Sig  Dispense  Refill  . acetaminophen (TYLENOL) 500 MG tablet   Oral   Take 500 mg by mouth every 4 (four) hours as needed for mild pain, fever or headache.          Marland Kitchen. alum & mag hydroxide-simeth (MAALOX/MYLANTA) 200-200-20 MG/5ML suspension   Oral   Take 30 mLs by mouth every 6 (six) hours as needed for indigestion or heartburn.         Marland Kitchen. amoxicillin (AMOXIL) 875 MG tablet   Oral   Take 875 mg by mouth 2 (two) times daily. For 10 days         . atorvastatin (LIPITOR) 80 MG tablet   Oral   Take 80 mg by mouth at bedtime.          . enalapril (VASOTEC) 20 MG tablet   Oral   Take 20 mg by  mouth every morning.          Marland Kitchen. guaiFENesin (ROBITUSSIN) 100 MG/5ML SOLN   Oral   Take 10 mLs by mouth every 6 (six) hours as needed for cough or to loosen phlegm.         . haloperidol (HALDOL) 1 MG tablet   Oral   Take 1 mg by mouth every 4 (four) hours as needed for agitation.         . hydrochlorothiazide (HYDRODIURIL) 12.5 MG tablet   Oral   Take 12.5 mg by mouth every morning.          . loperamide (IMODIUM A-D) 2 MG tablet   Oral   Take 2 mg by mouth as needed for diarrhea or loose stools.         Marland Kitchen. LORazepam (ATIVAN) 0.5 MG tablet   Oral   Take 0.5 mg by mouth 2 (two) times daily.         . magnesium hydroxide (MILK OF MAGNESIA) 400 MG/5ML suspension   Oral   Take 30 mLs by mouth daily as needed for mild constipation.         .Marland Kitchen  Melatonin 3 MG TABS   Oral   Take 1 tablet by mouth at bedtime.         . metoprolol (LOPRESSOR) 100 MG tablet   Oral   Take 100 mg by mouth 2 (two) times daily.         Marland Kitchen valproic acid (DEPAKENE) 250 MG capsule   Oral   Take 500 mg by mouth 3 (three) times daily.           BP 163/83  Pulse 71  Temp(Src) 97.8 F (36.6 C) (Oral)  Resp 20  SpO2 95% Physical Exam  Constitutional: He appears well-developed and well-nourished. No distress.  HENT:  Head: Normocephalic and atraumatic.  Right Ear: Hearing normal.  Left Ear: Hearing normal.  Nose: Nose normal.  Mouth/Throat: Oropharynx is clear and moist and mucous membranes are normal.  Eyes: Conjunctivae and EOM are normal. Pupils are equal, round, and reactive to light.  Neck: Normal range of motion. Neck supple.  Cardiovascular: Regular rhythm, S1 normal and S2 normal.  Exam reveals no gallop and no friction rub.   No murmur heard. Pulmonary/Chest: Effort normal and breath sounds normal. No respiratory distress. He exhibits no tenderness.  Abdominal: Soft. Normal appearance and bowel sounds are normal. There is no hepatosplenomegaly. There is no tenderness. There  is no rebound, no guarding, no tenderness at McBurney's point and negative Murphy's sign. No hernia.  Musculoskeletal: Normal range of motion.  Neurological: He is alert. He has normal strength. He is disoriented. No cranial nerve deficit or sensory deficit. Coordination normal. GCS eye subscore is 4. GCS verbal subscore is 4. GCS motor subscore is 6.  Skin: Skin is warm, dry and intact. No rash noted. No cyanosis.  Psychiatric: He has a normal mood and affect. His speech is normal and behavior is normal. Thought content normal.    ED Course  Procedures (including critical care time) Labs Review Labs Reviewed - No data to display Imaging Review Ct Head Wo Contrast  04/09/2013   CLINICAL DATA:  Fall and confusion  EXAM: CT HEAD WITHOUT CONTRAST  TECHNIQUE: Contiguous axial images were obtained from the base of the skull through the vertex without intravenous contrast.  COMPARISON:  CT HEAD W/O CM dated 02/10/2013  FINDINGS: Sinuses/Soft tissues: Clear paranasal sinuses and mastoid air cells.  Intracranial: Mild cerebral atrophy. Mild low density in the periventricular white matter likely related to small vessel disease. No mass lesion, hemorrhage, hydrocephalus, acute infarct, intra-axial, or extra-axial fluid collection.  IMPRESSION: 1. No acute or posttraumatic deformity 2. Mild small vessel ischemic change.   Electronically Signed   By: Jeronimo Greaves M.D.   On: 04/09/2013 19:49     EKG Interpretation None      MDM   Final diagnoses:  None    Patient presents once again after fall. He reportedly had a ground-level fall earlier today. Patient has been ambulatory here in the ER, does not appear to have any difficulty walking. No concern for hip injury. Examination of the back did not elicit any tenderness. Likewise neck was nontender. No significant cephalohematoma, bruising, abrasions or lacerations. Patient is at his baseline confusion without any focal neurologic findings. Patient had a  head CT yesterday after a fall, also had one last month. Patient has had frequent falls. I do not feel that there is any indication for yet another CT at this time. Patient will be returned to the nursing facility, can be monitored there for neurologic changes.  Gilda Crease, MD 04/10/13 [phone]

## 2013-04-10 NOTE — ED Notes (Signed)
[facility]  Found patient post fall per staff.  Patient fell and hit his head  In the front right side. No deformity or bleeding noted by EMS.  Patient ambulatory after fall.

## 2013-04-10 NOTE — ED Notes (Signed)
Patient is ambulatory.  He fell at his facility this morning.  He does not have any deformity or bleeding.  He has gotten out of the bed and needs to be closely watched.

## 2013-04-18 ENCOUNTER — Encounter (HOSPITAL_COMMUNITY): Payer: Self-pay | Admitting: Emergency Medicine

## 2013-04-18 ENCOUNTER — Emergency Department (HOSPITAL_COMMUNITY)
Admission: EM | Admit: 2013-04-18 | Discharge: 2013-04-18 | Disposition: A | Discharge status: Home / Self Care | Payer: Medicare Other | Attending: Emergency Medicine | Admitting: Emergency Medicine

## 2013-04-18 DIAGNOSIS — I10 Essential (primary) hypertension: Secondary | ICD-10-CM | POA: Insufficient documentation

## 2013-04-18 DIAGNOSIS — F3289 Other specified depressive episodes: Secondary | ICD-10-CM | POA: Insufficient documentation

## 2013-04-18 DIAGNOSIS — W19XXXA Unspecified fall, initial encounter: Secondary | ICD-10-CM | POA: Insufficient documentation

## 2013-04-18 DIAGNOSIS — Z79899 Other long term (current) drug therapy: Secondary | ICD-10-CM | POA: Insufficient documentation

## 2013-04-18 DIAGNOSIS — Z87891 Personal history of nicotine dependence: Secondary | ICD-10-CM | POA: Insufficient documentation

## 2013-04-18 DIAGNOSIS — R609 Edema, unspecified: Secondary | ICD-10-CM | POA: Insufficient documentation

## 2013-04-18 DIAGNOSIS — G309 Alzheimer's disease, unspecified: Secondary | ICD-10-CM | POA: Insufficient documentation

## 2013-04-18 DIAGNOSIS — S0181XA Laceration without foreign body of other part of head, initial encounter: Secondary | ICD-10-CM

## 2013-04-18 DIAGNOSIS — S01409A Unspecified open wound of unspecified cheek and temporomandibular area, initial encounter: Secondary | ICD-10-CM | POA: Insufficient documentation

## 2013-04-18 DIAGNOSIS — F329 Major depressive disorder, single episode, unspecified: Secondary | ICD-10-CM | POA: Insufficient documentation

## 2013-04-18 DIAGNOSIS — Y9210 Unspecified residential institution as the place of occurrence of the external cause: Secondary | ICD-10-CM | POA: Insufficient documentation

## 2013-04-18 DIAGNOSIS — F0280 Dementia in other diseases classified elsewhere without behavioral disturbance: Secondary | ICD-10-CM | POA: Insufficient documentation

## 2013-04-18 DIAGNOSIS — Y9389 Activity, other specified: Secondary | ICD-10-CM | POA: Insufficient documentation

## 2013-04-18 HISTORY — DX: Dementia in other diseases classified elsewhere, unspecified severity, without behavioral disturbance, psychotic disturbance, mood disturbance, and anxiety: F02.80

## 2013-04-18 HISTORY — DX: Alzheimer's disease, unspecified: G30.9

## 2013-04-18 NOTE — ED Provider Notes (Signed)
CSN: [id number]     Arrival date & time 04/18/13  0419 History   First MD Initiated Contact with Patient 04/18/13 [phone]     Chief Complaint  Patient presents with  . Fall  . Head Laceration     (Consider location/radiation/quality/duration/timing/severity/associated sxs/prior Treatment) HPI Comments: Patient with a history of falls was found wandering around the SNF with a laceration to lateral L eye area, no active bleeding   Patient is a 73 y.o. male presenting with fall and scalp laceration. The history is provided by the patient.  Fall This is a recurrent problem. The current episode started today. The problem has been unchanged. Pertinent negatives include no chest pain, fever, headaches, nausea or vomiting. Nothing aggravates the symptoms. He has tried nothing for the symptoms. The treatment provided no relief.  Head Laceration Pertinent negatives include no chest pain, fever, headaches, nausea or vomiting.    Past Medical History  Diagnosis Date  . Dementia   . Depression   . Hypertension   . Foot drop   . Hearing loss   . Alzheimer disease    Past Surgical History  Procedure Laterality Date  . No past surgeries     No family history on file. History  Substance Use Topics  . Smoking status: Former Games developer  . 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 Alcohol Use: No    Review of Systems  Constitutional: Negative for fever.  HENT: Positive for facial swelling.   Eyes: Negative for visual disturbance.  Cardiovascular: Negative for chest pain.  Gastrointestinal: Negative for nausea and vomiting.  Skin: Positive for wound.  Neurological: Negative for dizziness and headaches.  All other systems reviewed and are negative.      Allergies  Review of patient's allergies indicates no known allergies.  Home Medications   Current Outpatient Rx  Name  Route  Sig  Dispense  Refill  . acetaminophen (TYLENOL) 500 MG tablet   Oral  Take 500 mg by mouth every 4 (four) hours as needed for mild pain, fever or headache.          Marland Kitchen alum & mag hydroxide-simeth (MAALOX/MYLANTA) 200-200-20 MG/5ML suspension   Oral   Take 30 mLs by mouth every 6 (six) hours as needed for indigestion or heartburn.         Marland Kitchen atorvastatin (LIPITOR) 80 MG tablet   Oral   Take 80 mg by mouth at bedtime.          . enalapril (VASOTEC) 20 MG tablet   Oral   Take 20 mg by mouth every morning.          Marland Kitchen guaiFENesin (ROBITUSSIN) 100 MG/5ML SOLN   Oral   Take 10 mLs by mouth every 6 (six) hours as needed for cough or to loosen phlegm.         . haloperidol (HALDOL) 1 MG tablet   Oral   Take 1 mg by mouth every 4 (four) hours as needed for agitation.         . hydrochlorothiazide (HYDRODIURIL) 12.5 MG tablet   Oral   Take 12.5 mg by mouth every morning.          . loperamide (IMODIUM A-D) 2 MG tablet   Oral   Take 2 mg by mouth as needed for diarrhea or loose stools.         Marland Kitchen LORazepam (ATIVAN) 0.5 MG tablet   Oral  Take 0.5 mg by mouth 2 (two) times daily.         . magnesium hydroxide (MILK OF MAGNESIA) 400 MG/5ML suspension   Oral   Take 30 mLs by mouth daily as needed for mild constipation.         . Melatonin 3 MG TABS   Oral   Take 1 tablet by mouth at bedtime.         . metoprolol (LOPRESSOR) 100 MG tablet   Oral   Take 100 mg by mouth 2 (two) times daily.         Marland Kitchen. valproic acid (DEPAKENE) 250 MG capsule   Oral   Take 500 mg by mouth 3 (three) times daily.          Marland Kitchen. amoxicillin (AMOXIL) 875 MG tablet   Oral   Take 875 mg by mouth 2 (two) times daily.          BP 153/68  Pulse 64  Temp(Src) 97.5 F (36.4 C) (Oral)  Resp 18  Wt 193 lb (87.544 kg)  SpO2 96% Physical Exam  Nursing note and vitals reviewed. Constitutional: He appears well-nourished.  HENT:  Right Ear: External ear normal.  Left Ear: External ear normal.  Mouth/Throat: Oropharynx is clear and moist.  Eyes: Pupils  are equal, round, and reactive to light.    2.5 CM superficial laceration with surrounding bruising   Neck: Normal range of motion. No spinous process tenderness and no muscular tenderness present. Normal range of motion present.  Cardiovascular: Normal rate and regular rhythm.   Pulmonary/Chest: Effort normal and breath sounds normal.  Musculoskeletal: Normal range of motion. He exhibits no tenderness.  Neurological: He is alert.  Skin: Skin is warm.    ED Course  LACERATION REPAIR Date/Time: 04/18/2013 5:03 AM Performed by: Arman FilterSCHULZ, Alaijah Gibler K Authorized by: Arman FilterSCHULZ, Waylynn Benefiel K Consent: Verbal consent obtained. written consent not obtained. Risks and benefits: risks, benefits and alternatives were discussed Consent given by: patient Patient understanding: patient states understanding of the procedure being performed Patient identity confirmed: arm band Time out: Immediately prior to procedure a "time out" was called to verify the correct patient, procedure, equipment, support staff and site/side marked as required. Body area: head/neck Location details: left cheek Laceration length: 2.5 cm Foreign bodies: no foreign bodies Tendon involvement: none Nerve involvement: none Vascular damage: no Patient sedated: no Irrigation solution: saline Amount of cleaning: standard Debridement: none Degree of undermining: none Skin closure: glue Patient tolerance: Patient tolerated the procedure well with no immediate complications.   (including critical care time) Labs Review Labs Reviewed - No data to display Imaging Review No results found.   EKG Interpretation None     After examination the only injury identified was the facial laceration no other bruising, skin tears.  MDM   Final diagnoses:  Fall  Facial laceration    \    Arman FilterGail K Uva Runkel, NP 04/18/13 [phone]

## 2013-04-18 NOTE — ED Notes (Signed)
Per EMS patient had unwitnessed fall at SNF.  Pt was found walking around facility.  Bleeding controlled.  Laceration above left eyebrow.  Denies neck pain or headache.

## 2013-04-18 NOTE — ED Provider Notes (Signed)
Medical screening examination/treatment/procedure(s) were performed by non-physician practitioner and as supervising physician I was immediately available for consultation/collaboration.   EKG Interpretation None       Cheryln Balcom M Tahlor Berenguer, MD 04/18/13 0715 

## 2013-04-18 NOTE — ED Notes (Signed)
[facility] called for transport.  

## 2013-04-23 ENCOUNTER — Emergency Department (HOSPITAL_COMMUNITY)
Admission: EM | Admit: 2013-04-23 | Discharge: 2013-04-28 | Disposition: A | Discharge status: Other Acute Inpatient Hospital | Payer: Medicare Other | Attending: Emergency Medicine | Admitting: Emergency Medicine

## 2013-04-23 ENCOUNTER — Emergency Department (HOSPITAL_COMMUNITY): Payer: Medicare Other

## 2013-04-23 ENCOUNTER — Encounter (HOSPITAL_COMMUNITY): Payer: Self-pay | Admitting: Emergency Medicine

## 2013-04-23 DIAGNOSIS — F02818 Dementia in other diseases classified elsewhere, unspecified severity, with other behavioral disturbance: Secondary | ICD-10-CM | POA: Insufficient documentation

## 2013-04-23 DIAGNOSIS — H9190 Unspecified hearing loss, unspecified ear: Secondary | ICD-10-CM | POA: Insufficient documentation

## 2013-04-23 DIAGNOSIS — I10 Essential (primary) hypertension: Secondary | ICD-10-CM | POA: Insufficient documentation

## 2013-04-23 DIAGNOSIS — Z8739 Personal history of other diseases of the musculoskeletal system and connective tissue: Secondary | ICD-10-CM | POA: Insufficient documentation

## 2013-04-23 DIAGNOSIS — F0280 Dementia in other diseases classified elsewhere without behavioral disturbance: Secondary | ICD-10-CM | POA: Insufficient documentation

## 2013-04-23 DIAGNOSIS — Z792 Long term (current) use of antibiotics: Secondary | ICD-10-CM | POA: Insufficient documentation

## 2013-04-23 DIAGNOSIS — F3289 Other specified depressive episodes: Secondary | ICD-10-CM | POA: Insufficient documentation

## 2013-04-23 DIAGNOSIS — F0390 Unspecified dementia without behavioral disturbance: Secondary | ICD-10-CM

## 2013-04-23 DIAGNOSIS — G309 Alzheimer's disease, unspecified: Principal | ICD-10-CM | POA: Insufficient documentation

## 2013-04-23 DIAGNOSIS — F29 Unspecified psychosis not due to a substance or known physiological condition: Secondary | ICD-10-CM

## 2013-04-23 DIAGNOSIS — F329 Major depressive disorder, single episode, unspecified: Secondary | ICD-10-CM | POA: Insufficient documentation

## 2013-04-23 DIAGNOSIS — Z87891 Personal history of nicotine dependence: Secondary | ICD-10-CM | POA: Insufficient documentation

## 2013-04-23 DIAGNOSIS — F0281 Dementia in other diseases classified elsewhere with behavioral disturbance: Secondary | ICD-10-CM | POA: Insufficient documentation

## 2013-04-23 DIAGNOSIS — Z79899 Other long term (current) drug therapy: Secondary | ICD-10-CM | POA: Insufficient documentation

## 2013-04-23 LAB — CBC WITH DIFFERENTIAL/PLATELET
BASOS ABS: 0 10*3/uL (ref 0.0–0.1)
Basophils Relative: 1 % (ref 0–1)
Eosinophils Absolute: 0.1 10*3/uL (ref 0.0–0.7)
Eosinophils Relative: 2 % (ref 0–5)
HEMATOCRIT: 36.4 % — AB (ref 39.0–52.0)
Hemoglobin: 12.7 g/dL — ABNORMAL LOW (ref 13.0–17.0)
LYMPHS PCT: 28 % (ref 12–46)
Lymphs Abs: 1.1 10*3/uL (ref 0.7–4.0)
MCH: 30.2 pg (ref 26.0–34.0)
MCHC: 34.9 g/dL (ref 30.0–36.0)
MCV: 86.7 fL (ref 78.0–100.0)
MONO ABS: 0.5 10*3/uL (ref 0.1–1.0)
MONOS PCT: 13 % — AB (ref 3–12)
NEUTROS ABS: 2.3 10*3/uL (ref 1.7–7.7)
Neutrophils Relative %: 57 % (ref 43–77)
Platelets: 212 10*3/uL (ref 150–400)
RBC: 4.2 MIL/uL — AB (ref 4.22–5.81)
RDW: 13.7 % (ref 11.5–15.5)
WBC: 4 10*3/uL (ref 4.0–10.5)

## 2013-04-23 LAB — CBG MONITORING, ED: GLUCOSE-CAPILLARY: 134 mg/dL — AB (ref 70–99)

## 2013-04-23 LAB — COMPREHENSIVE METABOLIC PANEL
ALT: 14 U/L (ref 0–53)
AST: 17 U/L (ref 0–37)
Albumin: 3.2 g/dL — ABNORMAL LOW (ref 3.5–5.2)
Alkaline Phosphatase: 73 U/L (ref 39–117)
BILIRUBIN TOTAL: 0.4 mg/dL (ref 0.3–1.2)
BUN: 10 mg/dL (ref 6–23)
CO2: 25 meq/L (ref 19–32)
Calcium: 8.8 mg/dL (ref 8.4–10.5)
Chloride: 103 mEq/L (ref 96–112)
Creatinine, Ser: 0.75 mg/dL (ref 0.50–1.35)
GFR calc Af Amer: >90 mL/min (ref >90)
GFR, EST NON AFRICAN AMERICAN: 89 mL/min — AB (ref >90)
Glucose, Bld: 135 mg/dL — ABNORMAL HIGH (ref 70–99)
Potassium: 3.6 mEq/L — ABNORMAL LOW (ref 3.7–5.3)
Sodium: 140 mEq/L (ref 137–147)
Total Protein: 6.5 g/dL (ref 6.0–8.3)

## 2013-04-23 LAB — URINALYSIS, ROUTINE W REFLEX MICROSCOPIC
Bilirubin Urine: NEGATIVE
Glucose, UA: NEGATIVE mg/dL
HGB URINE DIPSTICK: NEGATIVE
KETONES UR: NEGATIVE mg/dL
Leukocytes, UA: NEGATIVE
Nitrite: NEGATIVE
Protein, ur: NEGATIVE mg/dL
Specific Gravity, Urine: 1.014 (ref 1.005–1.030)
UROBILINOGEN UA: 1 mg/dL (ref 0.0–1.0)
pH: 7 (ref 5.0–8.0)

## 2013-04-23 MED ORDER — ENALAPRIL MALEATE 20 MG PO TABS
20.0000 mg | ORAL_TABLET | Freq: Every day | ORAL | Status: DC
  Filled 2013-04-23: qty 1

## 2013-04-23 MED ORDER — METOPROLOL TARTRATE 25 MG PO TABS
100.0000 mg | ORAL_TABLET | Freq: Two times a day (BID) | ORAL | Status: DC
  Administered 2013-04-23 – 2013-04-28 (×10): 100 mg via ORAL
  Filled 2013-04-23 (×11): qty 4

## 2013-04-23 MED ORDER — IBUPROFEN 200 MG PO TABS: 600.0000 mg | ORAL_TABLET | Freq: Three times a day (TID) | ORAL | Status: DC | PRN

## 2013-04-23 MED ORDER — ALUM & MAG HYDROXIDE-SIMETH 200-200-20 MG/5ML PO SUSP: 30.0000 mL | ORAL | Status: DC | PRN

## 2013-04-23 MED ORDER — DIVALPROEX SODIUM 500 MG PO DR TAB
500.0000 mg | DELAYED_RELEASE_TABLET | Freq: Three times a day (TID) | ORAL | Status: DC
  Administered 2013-04-23 – 2013-04-24 (×5): 500 mg via ORAL
  Filled 2013-04-23 (×5): qty 1

## 2013-04-23 MED ORDER — LORAZEPAM 0.5 MG PO TABS
0.5000 mg | ORAL_TABLET | Freq: Two times a day (BID) | ORAL | Status: DC
  Administered 2013-04-23 – 2013-04-28 (×10): 0.5 mg via ORAL
  Filled 2013-04-23 (×10): qty 1

## 2013-04-23 MED ORDER — ATORVASTATIN CALCIUM 80 MG PO TABS
80.0000 mg | ORAL_TABLET | Freq: Every day | ORAL | Status: DC
  Administered 2013-04-23 – 2013-04-28 (×6): 80 mg via ORAL
  Filled 2013-04-23 (×6): qty 1

## 2013-04-23 MED ORDER — ONDANSETRON HCL 4 MG PO TABS: 4.0000 mg | ORAL_TABLET | Freq: Three times a day (TID) | ORAL | Status: DC | PRN

## 2013-04-23 MED ORDER — ENALAPRIL MALEATE 20 MG PO TABS
20.0000 mg | ORAL_TABLET | Freq: Every day | ORAL | Status: DC
  Administered 2013-04-24 – 2013-04-28 (×5): 20 mg via ORAL
  Filled 2013-04-23 (×5): qty 1

## 2013-04-23 MED ORDER — OLANZAPINE 5 MG PO TBDP
5.0000 mg | ORAL_TABLET | Freq: Two times a day (BID) | ORAL | Status: DC
  Administered 2013-04-24 (×2): 5 mg via ORAL
  Filled 2013-04-23 (×3): qty 1

## 2013-04-23 MED ORDER — HYDROCHLOROTHIAZIDE 12.5 MG PO CAPS
12.5000 mg | ORAL_CAPSULE | Freq: Once | ORAL | Status: AC
  Administered 2013-04-23: 12.5 mg via ORAL
  Filled 2013-04-23: qty 1

## 2013-04-23 MED ORDER — RAMELTEON 8 MG PO TABS
8.0000 mg | ORAL_TABLET | Freq: Every day | ORAL | Status: DC
  Administered 2013-04-23 – 2013-04-27 (×5): 8 mg via ORAL
  Filled 2013-04-23 (×8): qty 1

## 2013-04-23 MED ORDER — LORAZEPAM 1 MG PO TABS: 1.0000 mg | ORAL_TABLET | Freq: Three times a day (TID) | ORAL | Status: DC | PRN

## 2013-04-23 NOTE — ED Notes (Signed)
Pt care assumed, obtained verbal report.  Pt is resting comfortably at this time.
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
## 2013-04-23 NOTE — ED Notes (Signed)
Pt standing by his stretcher, took his clothes off.  Pt assisted back in bed.
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
## 2013-04-23 NOTE — ED Notes (Signed)
Pt found in the hallway-confused.  Assisted back to his room.

## 2013-04-23 NOTE — Progress Notes (Signed)
Pt's referral faxed to the following gero-psych facilities:  [facility], referral faxed [facility]- per [facility] currently at capacity but will be having d/c's tomorrow 3.15.15, referral faxed for review for wait list    [facility] Dis[facility] MHT

## 2013-04-24 ENCOUNTER — Encounter (HOSPITAL_COMMUNITY): Payer: Self-pay | Admitting: Registered Nurse

## 2013-04-24 DIAGNOSIS — F03918 Unspecified dementia, unspecified severity, with other behavioral disturbance: Secondary | ICD-10-CM

## 2013-04-24 DIAGNOSIS — F0391 Unspecified dementia with behavioral disturbance: Secondary | ICD-10-CM

## 2013-04-24 LAB — VALPROIC ACID LEVEL

## 2013-04-24 NOTE — ED Notes (Signed)
We place him onto a pneumatic hospital bed (inpatient bed) at this time.

## 2013-04-24 NOTE — Progress Notes (Signed)
MHT completed demographics [facility].  [facility] ZOXW#[id number] good through 04/30/13.  Blain PaisMichelle L Nyja Westbrook, MHT/NS

## 2013-04-24 NOTE — Progress Notes (Signed)
[facility] and they have info and MD to review later today.

## 2013-04-24 NOTE — Progress Notes (Signed)
Follow-up calls placed:  [facility]- no answer will follow up at a later time [facility]- per [facility] will not have d/c's today as expected and currently at capacity, have received referral but will not be reviewing until the am 3.16.15   [facility] MHT

## 2013-04-24 NOTE — ED Notes (Signed)
Pt is resting comfortably with eyes closed.  Symmetric rise and fall of his chest noted.

## 2013-04-24 NOTE — ED Provider Notes (Signed)
  This was a shared visit with a mid-level provided (NP or PA).  Throughout the patient's course I was available for consultation/collaboration.  I saw the relevant labs and studies - I agree with the interpretation.  On my exam the patient was in no distress.  He appeared in a similar state to prior evaluations by me.       Gerhard Munchobert Joshlynn Alfonzo, MD 04/24/13 [id number]

## 2013-04-24 NOTE — Consult Note (Signed)
Arthur Dunlap   Reason for Dunlap:  Dementia with agitation Referring Physician:  ED MD Wenceslao Loper is an 73 y.o. male. Total Time spent with patient: 30 minutes  Assessment: AXIS I:  Dementia AXIS II:  Deferred AXIS III:   Past Medical History  Diagnosis Date  . Dementia   . Depression   . Hypertension   . Foot drop   . Hearing loss   . Alzheimer disease    AXIS IV:  problems related to social environment and problems with primary support group AXIS V:  31-40 impairment in reality testing Review of Systems  Unable to perform ROS: dementia  Eyes:       Left eye swollen with bruising   Neurological: Negative.   Psychiatric/Behavioral: Positive for memory loss. The patient is nervous/anxious.    ' Plan:  Recommend psychiatric Inpatient admission when medically cleared. Recommend gero-psychiatry inpatient.  Subjective:   Arthur Dunlap is a 73 y.o. male patient admitted with agitation related to his dementia.  HPI: Patient oriented to self only.  During interview could not understand patients mumbled garbled speech only that eye injury was from "riding horses" Patient is calm.  Nurses states that there has been no difficulties with patient states that has been calm and cooperative through out the night other that trying to get out of bed alone.     HPI Elements:   Location:  generalized. Quality:  acute. Severity:  severe. Timing:  intermittent. Duration:  few days. Context:  dementia .  Past Psychiatric History: Past Medical History  Diagnosis Date  . Dementia   . Depression   . Hypertension   . Foot drop   . Hearing loss   . Alzheimer disease     reports that he has quit smoking. He has never used smokeless tobacco. He reports that he does not drink alcohol or use illicit drugs. History reviewed. No pertinent family history.         Allergies:  No Known Allergies  ACT Assessment Complete:  Yes:    Educational Status    Risk to  Self: Risk to self Is patient at risk for suicide?: No Substance abuse history and/or treatment for substance abuse?: No  Risk to Others:    Abuse:    Prior Inpatient Therapy:    Prior Outpatient Therapy:    Additional Information:      Objective: Blood pressure 129/72, pulse 62, temperature 98.7 F (37.1 C), temperature source Oral, resp. rate 16, SpO2 97.00%.There is no weight on file to calculate BMI. Results for orders placed during the hospital encounter of 04/23/13 (from the past 72 hour(s))  CBC WITH DIFFERENTIAL     Status: Abnormal   Collection Time    04/23/13  3:25 PM      Result Value Ref Range   WBC 4.0  4.0 - 10.5 K/uL   RBC 4.20 (*) 4.22 - 5.81 MIL/uL   Hemoglobin 12.7 (*) 13.0 - 17.0 g/dL   HCT 36.4 (*) 39.0 - 52.0 %   MCV 86.7  78.0 - 100.0 fL   MCH 30.2  26.0 - 34.0 pg   MCHC 34.9  30.0 - 36.0 g/dL   RDW 13.7  11.5 - 15.5 %   Platelets 212  150 - 400 K/uL   Neutrophils Relative % 57  43 - 77 %   Neutro Abs 2.3  1.7 - 7.7 K/uL   Lymphocytes Relative 28  12 - 46 %   Lymphs  Abs 1.1  0.7 - 4.0 K/uL   Monocytes Relative 13 (*) 3 - 12 %   Monocytes Absolute 0.5  0.1 - 1.0 K/uL   Eosinophils Relative 2  0 - 5 %   Eosinophils Absolute 0.1  0.0 - 0.7 K/uL   Basophils Relative 1  0 - 1 %   Basophils Absolute 0.0  0.0 - 0.1 K/uL  COMPREHENSIVE METABOLIC PANEL     Status: Abnormal   Collection Time    04/23/13  3:25 PM      Result Value Ref Range   Sodium 140  137 - 147 mEq/L   Potassium 3.6 (*) 3.7 - 5.3 mEq/L   Chloride 103  96 - 112 mEq/L   CO2 25  19 - 32 mEq/L   Glucose, Bld 135 (*) 70 - 99 mg/dL   BUN 10  6 - 23 mg/dL   Creatinine, Ser 0.75  0.50 - 1.35 mg/dL   Calcium 8.8  8.4 - 10.5 mg/dL   Total Protein 6.5  6.0 - 8.3 g/dL   Albumin 3.2 (*) 3.5 - 5.2 g/dL   AST 17  0 - 37 U/L   ALT 14  0 - 53 U/L   Alkaline Phosphatase 73  39 - 117 U/L   Total Bilirubin 0.4  0.3 - 1.2 mg/dL   GFR calc non Af Amer 89 (*) >90 mL/min   GFR calc Af Amer >90  >90  mL/min   Comment: (NOTE)     The eGFR has been calculated using the CKD EPI equation.     This calculation has not been validated in all clinical situations.     eGFR's persistently <90 mL/min signify possible Chronic Kidney     Disease.  URINALYSIS, ROUTINE W REFLEX MICROSCOPIC     Status: None   Collection Time    04/23/13  3:31 PM      Result Value Ref Range   Color, Urine YELLOW  YELLOW   APPearance CLEAR  CLEAR   Specific Gravity, Urine 1.014  1.005 - 1.030   pH 7.0  5.0 - 8.0   Glucose, UA NEGATIVE  NEGATIVE mg/dL   Hgb urine dipstick NEGATIVE  NEGATIVE   Bilirubin Urine NEGATIVE  NEGATIVE   Ketones, ur NEGATIVE  NEGATIVE mg/dL   Protein, ur NEGATIVE  NEGATIVE mg/dL   Urobilinogen, UA 1.0  0.0 - 1.0 mg/dL   Nitrite NEGATIVE  NEGATIVE   Leukocytes, UA NEGATIVE  NEGATIVE   Comment: MICROSCOPIC NOT DONE ON URINES WITH NEGATIVE PROTEIN, BLOOD, LEUKOCYTES, NITRITE, OR GLUCOSE <1000 mg/dL.  CBG MONITORING, ED     Status: Abnormal   Collection Time    04/23/13  3:35 PM      Result Value Ref Range   Glucose-Capillary 134 (*) 70 - 99 mg/dL   Comment 1 Notify RN    VALPROIC ACID LEVEL     Status: Abnormal   Collection Time    04/24/13 10:25 AM      Result Value Ref Range   Valproic Acid Lvl <10.0 (*) 50.0 - 100.0 ug/mL   Comment: Performed at [facility] are reviewed and are pertinent for medical issues being addressed. Valproic Acid level < 10.0   Current Facility-Administered Medications  Medication Dose Route Frequency Provider Last Rate Last Dose  . alum & mag hydroxide-simeth (MAALOX/MYLANTA) 200-200-20 MG/5ML suspension 30 mL  30 mL Oral PRN Hannah Muthersbaugh, PA-C      . atorvastatin (LIPITOR) tablet 80  mg  80 mg Oral q1800 Waylan Boga, NP   80 mg at 04/23/13 1840  . divalproex (DEPAKOTE) DR tablet 500 mg  500 mg Oral TID Waylan Boga, NP   500 mg at 04/24/13 0953  . enalapril (VASOTEC) tablet 20 mg  20 mg Oral Daily Carmin Muskrat, MD   20 mg at  04/24/13 0953  . ibuprofen (ADVIL,MOTRIN) tablet 600 mg  600 mg Oral Q8H PRN Hannah Muthersbaugh, PA-C      . LORazepam (ATIVAN) tablet 0.5 mg  0.5 mg Oral BID Waylan Boga, NP   0.5 mg at 04/24/13 0953  . metoprolol tartrate (LOPRESSOR) tablet 100 mg  100 mg Oral BID Waylan Boga, NP   100 mg at 04/24/13 0953  . OLANZapine zydis (ZYPREXA) disintegrating tablet 5 mg  5 mg Oral BID AC Waylan Boga, NP   5 mg at 04/24/13 0741  . ondansetron (ZOFRAN) tablet 4 mg  4 mg Oral Q8H PRN Hannah Muthersbaugh, PA-C      . ramelteon (ROZEREM) tablet 8 mg  8 mg Oral QHS Waylan Boga, NP   8 mg at 04/23/13 2235   Current Outpatient Prescriptions  Medication Sig Dispense Refill  . atorvastatin (LIPITOR) 80 MG tablet Take 80 mg by mouth at bedtime.       . enalapril (VASOTEC) 20 MG tablet Take 20 mg by mouth every morning.       . hydrochlorothiazide (HYDRODIURIL) 12.5 MG tablet Take 12.5 mg by mouth every morning.       Marland Kitchen LORazepam (ATIVAN) 0.5 MG tablet Take 0.5 mg by mouth 2 (two) times daily.      . Melatonin 3 MG TABS Take 1 tablet by mouth at bedtime.      . metoprolol (LOPRESSOR) 100 MG tablet Take 100 mg by mouth 2 (two) times daily.      Marland Kitchen valproic acid (DEPAKENE) 250 MG capsule Take 500 mg by mouth 3 (three) times daily.       Marland Kitchen acetaminophen (TYLENOL) 500 MG tablet Take 500 mg by mouth every 4 (four) hours as needed for mild pain, fever or headache.       Marland Kitchen alum & mag hydroxide-simeth (MAALOX/MYLANTA) 200-200-20 MG/5ML suspension Take 30 mLs by mouth every 6 (six) hours as needed for indigestion or heartburn.      Marland Kitchen amoxicillin (AMOXIL) 875 MG tablet Take 875 mg by mouth 2 (two) times daily.      . magnesium hydroxide (MILK OF MAGNESIA) 400 MG/5ML suspension Take 30 mLs by mouth daily as needed for mild constipation.        Psychiatric Specialty Exam:     Blood pressure 129/72, pulse 62, temperature 98.7 F (37.1 C), temperature source Oral, resp. rate 16, SpO2 97.00%.There is no weight on file  to calculate BMI.  General Appearance: Casual  Eye Contact::  Fair  Speech:  Slow  Volume:  Decreased  Mood:  Euthymic  Affect:  Congruent  Thought Process:  Disorganized and Irrelevant  Orientation:  Other:  oriented x 1  Thought Content:  unable to assess  Suicidal Thoughts:  Unable to assess  Homicidal Thoughts:  Unable to assess  Memory:  Poor  Judgement:  Impaired  Insight:  Lacking  Psychomotor Activity:  Decreased  Concentration:  Poor  Recall:  Poor  Fund of Knowledge:Poor  Language: Poor  Akathisia:  No  Handed:  Right  AIMS (if indicated):     Assets:  Leisure Time Resilience  Sleep:  Musculoskeletal: Strength & Muscle Tone: within normal limits Gait & Station: normal Patient leans: N/A  Treatment Plan Summary: Will continue with the current plan for inpatient gero-psychiatry.  Continue to monitor for safety and stabilization.  Arthur Newport, FNP-BC  04/24/2013 3:31 PM  Patient was seen face to face for psychiatric evaluation and case discussed with physician extender and formulated treatment plan. Reviewed the information documented and agree with the treatment plan.  Cody Albus,JANARDHAHA R. 04/24/2013 6:38 PM

## 2013-04-24 NOTE — ED Notes (Signed)
Patient belongings (2 bags) secured in TCU locker #27

## 2013-04-25 ENCOUNTER — Encounter (HOSPITAL_COMMUNITY): Payer: Self-pay | Admitting: Psychiatry

## 2013-04-25 DIAGNOSIS — F0390 Unspecified dementia without behavioral disturbance: Secondary | ICD-10-CM

## 2013-04-25 MED ORDER — DIVALPROEX SODIUM 500 MG PO DR TAB
500.0000 mg | DELAYED_RELEASE_TABLET | Freq: Two times a day (BID) | ORAL | Status: DC
  Administered 2013-04-25: 500 mg via ORAL
  Filled 2013-04-25 (×2): qty 1

## 2013-04-25 MED ORDER — OLANZAPINE 5 MG PO TBDP
5.0000 mg | ORAL_TABLET | Freq: Two times a day (BID) | ORAL | Status: DC
  Administered 2013-04-25 – 2013-04-28 (×6): 5 mg via ORAL
  Filled 2013-04-25 (×5): qty 1

## 2013-04-25 NOTE — ED Notes (Signed)
Pt incont urine, assisted to clean, change and reposition for comfort.  NAD.

## 2013-04-25 NOTE — Progress Notes (Signed)
CSW spoke with Junious Dresseronnie at [facility], who states that patient is on waitlst. Per Junious Dresseronnie, bed anticipated to be available tomorrow.   Byrd HesselbachKristen Krimson Massmann, LCSW [phone]  ED CSW 04/25/2013 1141am

## 2013-04-25 NOTE — ED Notes (Signed)
Pt becoming restless, able to be redirected at this time.  NAD.

## 2013-04-25 NOTE — ED Notes (Signed)
Pt incont urine, pt cleaned, fresh brief applied and pt repositioned.  Pt remains intermittently restless.  NAD.

## 2013-04-25 NOTE — ED Notes (Signed)
Safety sitter at bs.

## 2013-04-25 NOTE — ED Notes (Signed)
Pt given dinner tray, eating independently.

## 2013-04-25 NOTE — ED Notes (Signed)
Pt intermittently restless, requiring frequent redirection.  Charge RN aware.  NAD.

## 2013-04-25 NOTE — Progress Notes (Signed)
Pt recommended for geri psych placement.   [facility]-at capacity.  [facility]-at capacity however have patient information and will call back once there is availability.  [facility]- at capacity.  [facility]-at capacity [facility] at capacity ]  Declined: [facility] [place],  dementia exclusionary.

## 2013-04-25 NOTE — ED Notes (Signed)
Pt given lunch tray and tolerating well at this time.  Remains calm/coop.  Intermittently sleeping.  NAD.

## 2013-04-25 NOTE — Consult Note (Signed)
[facility] Face-to-Face Psychiatry Consult   Reason for Consult:  Increased agitation Referring Physician:  ED MD Arthur Dunlap is an 73 y.o. male. Total Time spent with patient: 20 minutes  Assessment: AXIS I:  Dementia AXIS II:  Deferred AXIS III:   Past Medical History  Diagnosis Date  . Dementia   . Depression   . Hypertension   . Foot drop   . Hearing loss   . Alzheimer disease    AXIS IV:  other psychosocial or environmental problems and problems related to social environment AXIS V:  31-40 impairment in reality testing  Review of Systems  Constitutional: Negative.   HENT: Negative.   Eyes: Negative.   Respiratory: Negative.   Cardiovascular: Negative.   Gastrointestinal: Negative.   Genitourinary: Negative.   Musculoskeletal: Negative.   Skin: Negative.   Neurological: Negative.   Endo/Heme/Allergies: Negative.   Psychiatric/Behavioral: Positive for memory loss.   Plan:  Recommend psychiatric Inpatient admission when medically cleared.  Dr. De Nurse has assessed the patient and concurs with the plan for the patient to go to gero-psychiatry.  Subjective:   Arthur Dunlap is a 73 y.o. male patient admitted with dementia and agitation.  HPI:  Patient was sedated this morning.  He mumbled a response but could not understand what he said.  Patient returned to resting quietly and leaning to his right in the bed. HPI Elements:   Location:  generalized . Quality:  acute. Severity:  severe. Timing:  intermittent. Duration:  past few days. Context:  stressors.  Past Psychiatric History: Past Medical History  Diagnosis Date  . Dementia   . Depression   . Hypertension   . Foot drop   . Hearing loss   . Alzheimer disease     reports that he has quit smoking. He has never used smokeless tobacco. He reports that he does not drink alcohol or use illicit drugs. History reviewed. No pertinent family history.         Allergies:  No Known Allergies  ACT Assessment  Complete:  Yes:    Educational Status    Risk to Self: Risk to self Is patient at risk for suicide?: No Substance abuse history and/or treatment for substance abuse?: No  Risk to Others:    Abuse:    Prior Inpatient Therapy:    Prior Outpatient Therapy:    Additional Information:                    Objective: Blood pressure 167/73, pulse 50, temperature 97.9 F (36.6 C), temperature source Axillary, resp. rate 16, SpO2 99.00%.There is no weight on file to calculate BMI. Results for orders placed during the hospital encounter of 04/23/13 (from the past 72 hour(s))  CBC WITH DIFFERENTIAL     Status: Abnormal   Collection Time    04/23/13  3:25 PM      Result Value Ref Range   WBC 4.0  4.0 - 10.5 K/uL   RBC 4.20 (*) 4.22 - 5.81 MIL/uL   Hemoglobin 12.7 (*) 13.0 - 17.0 g/dL   HCT 36.4 (*) 39.0 - 52.0 %   MCV 86.7  78.0 - 100.0 fL   MCH 30.2  26.0 - 34.0 pg   MCHC 34.9  30.0 - 36.0 g/dL   RDW 13.7  11.5 - 15.5 %   Platelets 212  150 - 400 K/uL   Neutrophils Relative % 57  43 - 77 %   Neutro Abs 2.3  1.7 - 7.7 K/uL  Lymphocytes Relative 28  12 - 46 %   Lymphs Abs 1.1  0.7 - 4.0 K/uL   Monocytes Relative 13 (*) 3 - 12 %   Monocytes Absolute 0.5  0.1 - 1.0 K/uL   Eosinophils Relative 2  0 - 5 %   Eosinophils Absolute 0.1  0.0 - 0.7 K/uL   Basophils Relative 1  0 - 1 %   Basophils Absolute 0.0  0.0 - 0.1 K/uL  COMPREHENSIVE METABOLIC PANEL     Status: Abnormal   Collection Time    04/23/13  3:25 PM      Result Value Ref Range   Sodium 140  137 - 147 mEq/L   Potassium 3.6 (*) 3.7 - 5.3 mEq/L   Chloride 103  96 - 112 mEq/L   CO2 25  19 - 32 mEq/L   Glucose, Bld 135 (*) 70 - 99 mg/dL   BUN 10  6 - 23 mg/dL   Creatinine, Ser 0.75  0.50 - 1.35 mg/dL   Calcium 8.8  8.4 - 10.5 mg/dL   Total Protein 6.5  6.0 - 8.3 g/dL   Albumin 3.2 (*) 3.5 - 5.2 g/dL   AST 17  0 - 37 U/L   ALT 14  0 - 53 U/L   Alkaline Phosphatase 73  39 - 117 U/L   Total Bilirubin 0.4  0.3 -  1.2 mg/dL   GFR calc non Af Amer 89 (*) >90 mL/min   GFR calc Af Amer >90  >90 mL/min   Comment: (NOTE)     The eGFR has been calculated using the CKD EPI equation.     This calculation has not been validated in all clinical situations.     eGFR's persistently <90 mL/min signify possible Chronic Kidney     Disease.  URINALYSIS, ROUTINE W REFLEX MICROSCOPIC     Status: None   Collection Time    04/23/13  3:31 PM      Result Value Ref Range   Color, Urine YELLOW  YELLOW   APPearance CLEAR  CLEAR   Specific Gravity, Urine 1.014  1.005 - 1.030   pH 7.0  5.0 - 8.0   Glucose, UA NEGATIVE  NEGATIVE mg/dL   Hgb urine dipstick NEGATIVE  NEGATIVE   Bilirubin Urine NEGATIVE  NEGATIVE   Ketones, ur NEGATIVE  NEGATIVE mg/dL   Protein, ur NEGATIVE  NEGATIVE mg/dL   Urobilinogen, UA 1.0  0.0 - 1.0 mg/dL   Nitrite NEGATIVE  NEGATIVE   Leukocytes, UA NEGATIVE  NEGATIVE   Comment: MICROSCOPIC NOT DONE ON URINES WITH NEGATIVE PROTEIN, BLOOD, LEUKOCYTES, NITRITE, OR GLUCOSE <1000 mg/dL.  CBG MONITORING, ED     Status: Abnormal   Collection Time    04/23/13  3:35 PM      Result Value Ref Range   Glucose-Capillary 134 (*) 70 - 99 mg/dL   Comment 1 Notify RN    VALPROIC ACID LEVEL     Status: Abnormal   Collection Time    04/24/13 10:25 AM      Result Value Ref Range   Valproic Acid Lvl <10.0 (*) 50.0 - 100.0 ug/mL   Comment: Performed at [facility] are reviewed and are pertinent for medical issues being addressed.  Current Facility-Administered Medications  Medication Dose Route Frequency Provider Last Rate Last Dose  . alum & mag hydroxide-simeth (MAALOX/MYLANTA) 200-200-20 MG/5ML suspension 30 mL  30 mL Oral PRN Jarrett Soho Muthersbaugh, PA-C      .  atorvastatin (LIPITOR) tablet 80 mg  80 mg Oral q1800 Waylan Boga, NP   80 mg at 04/24/13 1724  . divalproex (DEPAKOTE) DR tablet 500 mg  500 mg Oral BID Waylan Boga, NP      . enalapril (VASOTEC) tablet 20 mg  20 mg Oral Daily  Carmin Muskrat, MD   20 mg at 04/24/13 0953  . ibuprofen (ADVIL,MOTRIN) tablet 600 mg  600 mg Oral Q8H PRN Hannah Muthersbaugh, PA-C      . LORazepam (ATIVAN) tablet 0.5 mg  0.5 mg Oral BID Waylan Boga, NP   0.5 mg at 04/24/13 2157  . metoprolol tartrate (LOPRESSOR) tablet 100 mg  100 mg Oral BID Waylan Boga, NP   100 mg at 04/24/13 2157  . ondansetron (ZOFRAN) tablet 4 mg  4 mg Oral Q8H PRN Hannah Muthersbaugh, PA-C      . ramelteon (ROZEREM) tablet 8 mg  8 mg Oral QHS Waylan Boga, NP   8 mg at 04/24/13 2223   Current Outpatient Prescriptions  Medication Sig Dispense Refill  . atorvastatin (LIPITOR) 80 MG tablet Take 80 mg by mouth at bedtime.       . enalapril (VASOTEC) 20 MG tablet Take 20 mg by mouth every morning.       . hydrochlorothiazide (HYDRODIURIL) 12.5 MG tablet Take 12.5 mg by mouth every morning.       Marland Kitchen LORazepam (ATIVAN) 0.5 MG tablet Take 0.5 mg by mouth 2 (two) times daily.      . Melatonin 3 MG TABS Take 1 tablet by mouth at bedtime.      . metoprolol (LOPRESSOR) 100 MG tablet Take 100 mg by mouth 2 (two) times daily.      Marland Kitchen valproic acid (DEPAKENE) 250 MG capsule Take 500 mg by mouth 3 (three) times daily.       Marland Kitchen acetaminophen (TYLENOL) 500 MG tablet Take 500 mg by mouth every 4 (four) hours as needed for mild pain, fever or headache.       Marland Kitchen alum & mag hydroxide-simeth (MAALOX/MYLANTA) 200-200-20 MG/5ML suspension Take 30 mLs by mouth every 6 (six) hours as needed for indigestion or heartburn.      Marland Kitchen amoxicillin (AMOXIL) 875 MG tablet Take 875 mg by mouth 2 (two) times daily.      . magnesium hydroxide (MILK OF MAGNESIA) 400 MG/5ML suspension Take 30 mLs by mouth daily as needed for mild constipation.        Psychiatric Specialty Exam:     Blood pressure 167/73, pulse 50, temperature 97.9 F (36.6 C), temperature source Axillary, resp. rate 16, SpO2 99.00%.There is no weight on file to calculate BMI.  General Appearance: Disheveled  Eye Sport and exercise psychologist::  Fair  Speech:   mumbles  Volume:  Decreased  Mood:  drowsy  Affect:  Congruent  Thought Process:  unable to assess  Orientation:  Other:  unable to assess, patient was too drowsy  Thought Content:  unable to assess, patient too drowsy  Suicidal Thoughts:  Unable to assess, patient too drowsy  Homicidal Thoughts:  Unable to assess, patient too drowsy  Memory:  Immediate;   Poor Recent;   Poor Remote;   Poor  Judgement:  Impaired  Insight:  unable to assess  Psychomotor Activity:  Decreased  Concentration:  Poor  Recall:  Poor  Fund of Knowledge:Poor  Language: Poor  Akathisia:  No  Handed:  Right  AIMS (if indicated):     Assets:  Leisure Time Resilience  Sleep:  Musculoskeletal: Strength & Muscle Tone: decreased Gait & Station: did not ambulate Patient leans: right  Treatment Plan Summary: Patient's Depakote 500 mg TID decreased to BID due increased sedation,  Zyprexa discontinued.  Awaiting a bed in geropsychiatry.  Waylan Boga, [place] 04/25/2013 10:25 AM I have personally seen the patient and agreed with the findings and involved in the treatment plan. Excessive sedation. Plan to d/c zyprexa. Merian Capron, MD

## 2013-04-25 NOTE — ED Notes (Signed)
Initial Contact - pt resting on stretcher, eating from breakfast tray at this time independently.  Skin PWD.  RR even/un-lab.  Pt calm/coop at this time.  NAD.

## 2013-04-26 MED ORDER — LORAZEPAM 0.5 MG PO TABS
0.5000 mg | ORAL_TABLET | Freq: Once | ORAL | Status: AC
  Administered 2013-04-26: 0.5 mg via ORAL
  Filled 2013-04-26: qty 1

## 2013-04-26 MED ORDER — HYDROCHLOROTHIAZIDE 12.5 MG PO CAPS
12.5000 mg | ORAL_CAPSULE | Freq: Every day | ORAL | Status: DC
  Administered 2013-04-26 – 2013-04-28 (×3): 12.5 mg via ORAL
  Filled 2013-04-26 (×3): qty 1

## 2013-04-26 MED ORDER — ACETAMINOPHEN 500 MG PO TABS: 500.0000 mg | ORAL_TABLET | ORAL | Status: DC | PRN

## 2013-04-26 MED ORDER — OLANZAPINE 2.5 MG PO TABS
2.5000 mg | ORAL_TABLET | Freq: Once | ORAL | Status: AC
  Administered 2013-04-26: 2.5 mg via ORAL
  Filled 2013-04-26: qty 1

## 2013-04-26 MED ORDER — MELATONIN 3 MG PO TABS: 1.0000 | ORAL_TABLET | Freq: Every day | ORAL | Status: DC

## 2013-04-26 MED ORDER — MAGNESIUM HYDROXIDE 400 MG/5ML PO SUSP: 30.0000 mL | Freq: Every day | ORAL | Status: DC | PRN

## 2013-04-26 MED ORDER — VALPROIC ACID 250 MG PO CAPS
500.0000 mg | ORAL_CAPSULE | Freq: Three times a day (TID) | ORAL | Status: DC
  Administered 2013-04-26 – 2013-04-28 (×8): 500 mg via ORAL
  Filled 2013-04-26 (×10): qty 2

## 2013-04-26 MED ORDER — HYDROCHLOROTHIAZIDE 25 MG PO TABS
12.5000 mg | ORAL_TABLET | Freq: Every morning | ORAL | Status: DC
  Filled 2013-04-26: qty 0.5

## 2013-04-26 NOTE — Progress Notes (Signed)
CSW spoke with [facility] Dunlap at [facility]. Pt bed still pending waiting on discharge for bed availability. Pt still anticipated to have bed available later today or first thing in the morning.   Byrd HesselbachKristen Jourdain Guay, LCSW [phone]  ED CSW 04/26/2013 1244pm

## 2013-04-26 NOTE — Progress Notes (Signed)
CSW spoke with Nicholos JohnsKathleen at [place] who anticipates to have a bed available for patient later today. CSW sent requested updated clinicals. CSW to have bed assignment around noon today per Nicholos JohnsKathleen.   Byrd HesselbachKristen Edell Mesenbrink, LCSW [phone]  ED CSW 04/26/2013 928am

## 2013-04-26 NOTE — Consult Note (Signed)
Face to face evaluation and I agree with this note 

## 2013-04-26 NOTE — Progress Notes (Signed)
[facility] pending IVC papers, vitals, and mars. Patient ivc papers were not completed correctly by officer. CSW called communications to arrange officer to complete part A. ONce completed, please fax [facility] at [phone].   Byrd HesselbachKristen Jaylie Neaves, LCSW [phone]  ED CSW 04/26/2013 0211pm

## 2013-04-26 NOTE — Consult Note (Signed)
[facility] Face-to-Face Psychiatry Consult   Reason for Consult:  Increased agitation  Referring Physician:  EDMD Arthur Dunlap is an 73 y.o. male. Total Time spent with patient: 15 minutes  Assessment:  AXIS I: Dementia  AXIS II: Deferred  AXIS III:  Past Medical History   Diagnosis  Date   .  Dementia    .  Depression    .  Hypertension    .  Foot drop    .  Hearing loss    .  Alzheimer disease     AXIS IV: other psychosocial or environmental problems and problems related to social environment  AXIS V: 31-40 impairment in reality testing    Plan:  Recommend psychiatric Inpatient admission when medically cleared.  Subjective:   Arthur Dunlap is a 73 y.o. male patient admitted with worsening agitation and aggression from the nursing home.  HPI: Arthur Dunlap was referred due to increased agitation and aggression. HPI Elements:   Location:  WLED. Quality:  severe. Severity:  moderate to severe. Timing:  unable to ascertain. Duration:  unclear. Context:  patient was sent from nursing home facility.  Past Psychiatric History: Past Medical History  Diagnosis Date  . Dementia   . Depression   . Hypertension   . Foot drop   . Hearing loss   . Alzheimer disease     reports that he has quit smoking. He has never used smokeless tobacco. He reports that he does not drink alcohol or use illicit drugs. History reviewed. No pertinent family history.     Allergies:  No Known Allergies  ACT Assessment Complete:  Yes:    Educational Status    Risk to Self: Risk to self Is patient at risk for suicide?: No Substance abuse history and/or treatment for substance abuse?: No  Risk to Others:    Abuse:    Prior Inpatient Therapy:    Prior Outpatient Therapy:    Additional Information:     Objective: Blood pressure 106/58, pulse 56, temperature 98.2 F (36.8 C), temperature source Oral, resp. rate 20, SpO2 99.00%.There is no weight on file to calculate BMI. Results for orders placed  during the hospital encounter of 04/23/13 (from the past 72 hour(s))  CBC WITH DIFFERENTIAL     Status: Abnormal   Collection Time    04/23/13  3:25 PM      Result Value Ref Range   WBC 4.0  4.0 - 10.5 K/uL   RBC 4.20 (*) 4.22 - 5.81 MIL/uL   Hemoglobin 12.7 (*) 13.0 - 17.0 g/dL   HCT 36.4 (*) 39.0 - 52.0 %   MCV 86.7  78.0 - 100.0 fL   MCH 30.2  26.0 - 34.0 pg   MCHC 34.9  30.0 - 36.0 g/dL   RDW 13.7  11.5 - 15.5 %   Platelets 212  150 - 400 K/uL   Neutrophils Relative % 57  43 - 77 %   Neutro Abs 2.3  1.7 - 7.7 K/uL   Lymphocytes Relative 28  12 - 46 %   Lymphs Abs 1.1  0.7 - 4.0 K/uL   Monocytes Relative 13 (*) 3 - 12 %   Monocytes Absolute 0.5  0.1 - 1.0 K/uL   Eosinophils Relative 2  0 - 5 %   Eosinophils Absolute 0.1  0.0 - 0.7 K/uL   Basophils Relative 1  0 - 1 %   Basophils Absolute 0.0  0.0 - 0.1 K/uL  COMPREHENSIVE METABOLIC PANEL  Status: Abnormal   Collection Time    04/23/13  3:25 PM      Result Value Ref Range   Sodium 140  137 - 147 mEq/L   Potassium 3.6 (*) 3.7 - 5.3 mEq/L   Chloride 103  96 - 112 mEq/L   CO2 25  19 - 32 mEq/L   Glucose, Bld 135 (*) 70 - 99 mg/dL   BUN 10  6 - 23 mg/dL   Creatinine, Ser 0.75  0.50 - 1.35 mg/dL   Calcium 8.8  8.4 - 10.5 mg/dL   Total Protein 6.5  6.0 - 8.3 g/dL   Albumin 3.2 (*) 3.5 - 5.2 g/dL   AST 17  0 - 37 U/L   ALT 14  0 - 53 U/L   Alkaline Phosphatase 73  39 - 117 U/L   Total Bilirubin 0.4  0.3 - 1.2 mg/dL   GFR calc non Af Amer 89 (*) >90 mL/min   GFR calc Af Amer >90  >90 mL/min   Comment: (NOTE)     The eGFR has been calculated using the CKD EPI equation.     This calculation has not been validated in all clinical situations.     eGFR's persistently <90 mL/min signify possible Chronic Kidney     Disease.  URINALYSIS, ROUTINE W REFLEX MICROSCOPIC     Status: None   Collection Time    04/23/13  3:31 PM      Result Value Ref Range   Color, Urine YELLOW  YELLOW   APPearance CLEAR  CLEAR   Specific Gravity,  Urine 1.014  1.005 - 1.030   pH 7.0  5.0 - 8.0   Glucose, UA NEGATIVE  NEGATIVE mg/dL   Hgb urine dipstick NEGATIVE  NEGATIVE   Bilirubin Urine NEGATIVE  NEGATIVE   Ketones, ur NEGATIVE  NEGATIVE mg/dL   Protein, ur NEGATIVE  NEGATIVE mg/dL   Urobilinogen, UA 1.0  0.0 - 1.0 mg/dL   Nitrite NEGATIVE  NEGATIVE   Leukocytes, UA NEGATIVE  NEGATIVE   Comment: MICROSCOPIC NOT DONE ON URINES WITH NEGATIVE PROTEIN, BLOOD, LEUKOCYTES, NITRITE, OR GLUCOSE <1000 mg/dL.  CBG MONITORING, ED     Status: Abnormal   Collection Time    04/23/13  3:35 PM      Result Value Ref Range   Glucose-Capillary 134 (*) 70 - 99 mg/dL   Comment 1 Notify RN    VALPROIC ACID LEVEL     Status: Abnormal   Collection Time    04/24/13 10:25 AM      Result Value Ref Range   Valproic Acid Lvl <10.0 (*) 50.0 - 100.0 ug/mL   Comment: Performed at [facility] are reviewed and are pertinent for .  Current Facility-Administered Medications  Medication Dose Route Frequency Provider Last Rate Last Dose  . acetaminophen (TYLENOL) tablet 500 mg  500 mg Oral Q4H PRN Waylan Boga, NP      . alum & mag hydroxide-simeth (MAALOX/MYLANTA) 200-200-20 MG/5ML suspension 30 mL  30 mL Oral PRN Hannah Muthersbaugh, PA-C      . atorvastatin (LIPITOR) tablet 80 mg  80 mg Oral q1800 Waylan Boga, NP   80 mg at 04/25/13 2101  . enalapril (VASOTEC) tablet 20 mg  20 mg Oral Daily Carmin Muskrat, MD   20 mg at 04/26/13 1245  . hydrochlorothiazide (MICROZIDE) capsule 12.5 mg  12.5 mg Oral Daily Waylan Boga, NP   12.5 mg at 04/26/13 0919  . ibuprofen (ADVIL,MOTRIN)  tablet 600 mg  600 mg Oral Q8H PRN Hannah Muthersbaugh, PA-C      . LORazepam (ATIVAN) tablet 0.5 mg  0.5 mg Oral BID Waylan Boga, NP   0.5 mg at 04/26/13 0925  . magnesium hydroxide (MILK OF MAGNESIA) suspension 30 mL  30 mL Oral Daily PRN Waylan Boga, NP      . metoprolol tartrate (LOPRESSOR) tablet 100 mg  100 mg Oral BID Waylan Boga, NP   100 mg at 04/26/13 0920   . OLANZapine zydis (ZYPREXA) disintegrating tablet 5 mg  5 mg Oral BID Waylan Boga, NP   5 mg at 04/26/13 0924  . ondansetron (ZOFRAN) tablet 4 mg  4 mg Oral Q8H PRN Hannah Muthersbaugh, PA-C      . ramelteon (ROZEREM) tablet 8 mg  8 mg Oral QHS Waylan Boga, NP   8 mg at 04/25/13 2101  . valproic acid (DEPAKENE) 250 MG capsule 500 mg  500 mg Oral TID Waylan Boga, NP   500 mg at 04/26/13 2409   Current Outpatient Prescriptions  Medication Sig Dispense Refill  . atorvastatin (LIPITOR) 80 MG tablet Take 80 mg by mouth at bedtime.       . enalapril (VASOTEC) 20 MG tablet Take 20 mg by mouth every morning.       . hydrochlorothiazide (HYDRODIURIL) 12.5 MG tablet Take 12.5 mg by mouth every morning.       Marland Kitchen LORazepam (ATIVAN) 0.5 MG tablet Take 0.5 mg by mouth 2 (two) times daily.      . Melatonin 3 MG TABS Take 1 tablet by mouth at bedtime.      . metoprolol (LOPRESSOR) 100 MG tablet Take 100 mg by mouth 2 (two) times daily.      Marland Kitchen valproic acid (DEPAKENE) 250 MG capsule Take 500 mg by mouth 3 (three) times daily.       Marland Kitchen acetaminophen (TYLENOL) 500 MG tablet Take 500 mg by mouth every 4 (four) hours as needed for mild pain, fever or headache.       Marland Kitchen alum & mag hydroxide-simeth (MAALOX/MYLANTA) 200-200-20 MG/5ML suspension Take 30 mLs by mouth every 6 (six) hours as needed for indigestion or heartburn.      Marland Kitchen amoxicillin (AMOXIL) 875 MG tablet Take 875 mg by mouth 2 (two) times daily.      . magnesium hydroxide (MILK OF MAGNESIA) 400 MG/5ML suspension Take 30 mLs by mouth daily as needed for mild constipation.        Psychiatric Specialty Exam:     Blood pressure 106/58, pulse 56, temperature 98.2 F (36.8 C), temperature source Oral, resp. rate 20, SpO2 99.00%.There is no weight on file to calculate BMI.  General Appearance: Disheveled  Eye Contact::  Poor  Speech:  none  Volume:  none  Mood:    Affect:  Congruent  Thought Process:  unable to assess  Orientation:  NA  Thought  Content:  Unable to assess  Suicidal Thoughts:  Unable to assess  Homicidal Thoughts:  Unable to assess  Memory:  poor  Judgement:  Impaired  Insight:  Lacking  Psychomotor Activity:  Decreased  Concentration:  Poor  Recall:  Poor  Fund of Knowledge:Poor  Language: NA  Akathisia:  No  Handed:    AIMS (if indicated):     Assets:  Social Support  Sleep:      Musculoskeletal: Strength & Muscle Tone: decreased Gait & Station: unable to stand Patient leans: N/A  Treatment Plan Summary: 1. Patient has  been accepted to [place] unit. 2. Patient will be transferred when bed is available. 3. LCSW will arrange transportation. 4. Meds will be continued as written. Arthur Dunlap. Arthur Dunlap [facility] 04/26/2013 12:13 PM

## 2013-04-26 NOTE — Progress Notes (Signed)
PHARMACIST - PHYSICIAN ORDER COMMUNICATION  CONCERNING: P&T Medication Policy on Herbal Medications  DESCRIPTION:  This patient's order for:  Melatonin  has been noted.  This product(s) is classified as an "herbal" or natural product. Due to a lack of definitive safety studies or FDA approval, nonstandard manufacturing practices, plus the potential risk of unknown drug-drug interactions while on inpatient medications, the Pharmacy and Therapeutics Committee does not permit the use of "herbal" or natural products of this type within [facility].   ACTION TAKEN: The pharmacy department is unable to verify this order at this time and your patient has been informed of this safety policy. Please reevaluate patient's clinical condition at discharge and address if the herbal or natural product(s) should be resumed at that time.  Thank you,  Otho BellowsGreen, Korion Cuevas L PharmD Pager [phone] 04/26/2013, 1:48 PM

## 2013-04-26 NOTE — ED Notes (Signed)
Pt has 1cm bruise on R buttock. Also has 4 cm bruise on L hip.

## 2013-04-26 NOTE — Progress Notes (Signed)
CSW spoke with Nicholos JohnsKathleen at [facility] [place], pt bed to be available tomorrow. Pt remains on [facility] waitlist, but no bed available today.   Byrd HesselbachKristen Roanne Haye, LCSW [phone]  ED CSW 04/26/2013 1353pm

## 2013-04-26 NOTE — ED Provider Notes (Signed)
Patient has been accepted at [facility] by Vella RaringBen Palombo NP.   Dione Boozeavid Romana Deaton, MD 04/26/13 [phone]

## 2013-04-27 NOTE — ED Notes (Signed)
Called admitting to [facility] at [phone], verified that pt had bed assignment. Report called. Sheriff called, stated that "we dont transport after 7pm". Officer informed that pt will lose bed if he is not transported tonight. Stated that "we dont transport after 7pm". Charge nurse Misty StanleyStacey informed.

## 2013-04-27 NOTE — ED Notes (Signed)
Spoke with [facility]. Pt is cleared to go to [facility] for care. Explained that, that information was given prior to call and pt could not be transported d/t Sheriff not transporting after 7pm. [facility] coordinator [facility] and call [facility] back to see if pt placement could be held until transportation tomorrow. Per [facility] pt bed placement will be held and pt can come in the morning. Report to be called to [phone] in the morning.

## 2013-04-27 NOTE — Progress Notes (Signed)
Patient bed to be available at 6pm. Pt rn to call [facility] at [phone] at 6pm to receiving accepting dr. And room assignment at that time, patient transportation can be arranged.   Byrd HesselbachKristen Vidal Lampkins, LCSW [phone]  ED CSW 04/27/2013 1348pm

## 2013-04-27 NOTE — ED Notes (Signed)
sherriff's department called and request made to transport pt to [facility]. Pt will not be transport until the am.

## 2013-04-27 NOTE — Progress Notes (Signed)
CSW spoke with Nicholos JohnsKathleen who states that bed is not available yet, and will call when bed is available. CS[facility] spoke with [facility], who prefers patient to go to [facility] if bed is available.   CSW spoke with patient spouse who is in agreement with plan to discharge [facility].   CSW informed RN to delay sheriff transport as bed is not available.   Byrd HesselbachKristen Elexus Barman, LCSW [phone]  ED CSW 04/27/2013 754am

## 2013-04-27 NOTE — ED Notes (Signed)
Pt's soft restraints removed. Pt observed to be cooperative and calm, states "I'm behaving".

## 2013-04-27 NOTE — Progress Notes (Signed)
Write spoke with staff at [facility]. Pt bed will be held and available tomorrow morning.  Shanda BumpsJessica Davonte Siebenaler,MHT

## 2013-04-27 NOTE — Consult Note (Signed)
  Mr Arthur Dunlap is sedated and sleeping today.  There is no reason to change his disposition to transfer to [facility] today.

## 2013-04-28 NOTE — Consult Note (Signed)
  Mr Arthur Dunlap was not able to be transported to [facility] on time and consequently he is still here.  He remains sedated and not talking to us.  He is sedated because he tries to walk and he cannot without falling.  He cannot be reasoned with so he is sedated for his own safety.  Still awaiting whatever geriatric bed can be found.

## 2013-04-28 NOTE — Progress Notes (Signed)
Pt accepted to [facility] since 04/25/2013, however due to limited admissions with live with epic, [facility] was unable to admit patient until Yesterday 04/26/2013, and each day delays of other discharges prevented patient from being admitted. [facility], however due to patient being acceptd and [facility] stating they could take patient on 04/27/2013, pt was taken off of [facility] waitlist.   CSW spoke with Delorise ShinerGrace this morning at [facility] who states that due to not having an admission nurse, and no admissions allowed today.    CSW spoke with Junious DresserConnie, who states a new [facility] referral would need to be made.   CSW spoke with Agustin Creearlene at [facility],  and beds available. CSW faxed referral and confirmed. Pt currently being reviewed by MD for admission today.   Byrd HesselbachKristen Layani Foronda, LCSW [phone]  ED CSW 04/28/2013 941am

## 2013-04-28 NOTE — ED Notes (Signed)
Pt sleeping at this time, not awake enough to take medications at this time.

## 2013-04-28 NOTE — Progress Notes (Signed)
CSW received a call from [place] [facility] at 5:43pm reporting that transport is on their way to pick up the patient.  CSW informed the MD and nurse in order to arrange discharge and transfer.      Maryelizabeth Rowanressa Diarra Ceja, MSW, [place]CSWA, 04/28/2013 Evening Clinical Social Worker [phone]

## 2013-04-28 NOTE — ED Notes (Addendum)
Attempted to get pt up and walk, pt needed 2 person assist to walk and was still unsteady. Restraints discontinued.

## 2013-04-28 NOTE — Progress Notes (Addendum)
Pt accepted [facility], bed anticipated to be available tomorrow.  Pt [facility], bed anticipated to be available. Admissions delayed until after 7pm. Pt under IVC and can not be transported after 7pm with sheriff escort due to sheriff transportation policy.   Pt unable to [facility] at this time due to being sedated and having been in restraints recently. CSW will continue to update ALF, if patient psychiatrically stable before being placed in geri psych placement.   Pt family reports that patient family can follow ptar to transport patient voluntarily in order for patient to be admitted this evening to [facility].  Pt family locating HCPOA form and will provide that to evening CSW  in order to be able to sign patient in voluntarily.   CSW spoke with Delorise ShinerGrace at [facility] who stated that this should not be a problem. Evening CSW to call [phone] at 7pm to speak with Victorino DikeJennifer regarding room assignment and plan.   Byrd HesselbachKristen Azrielle Springsteen, LCSW [phone]  ED CSW 04/28/2013 1318pm     Pt family only has Durable POA. Patient wife unable to sign patient in voluntarily to any geripsych facility. Pt will have to remain under IVC [facility]. Tomorrow.   Byrd HesselbachKristen Corday Wyka, LCSW [phone]  ED CSW 04/28/2013 1445pm

## 2013-04-28 NOTE — ED Notes (Signed)
Per SW, Pt can not go to [facility] until tomorrow.  If Pt is out of restraints and nonsedated for 24hrs he can return to snf.  However, if he returns to snf, he will be moved to the bottom of all other facility lists.    Pt is wheelchair bound and will need to be transported by [facility].

## 2013-04-28 NOTE — Progress Notes (Signed)
CSW spoke with Delorise ShinerGrace to confirm the plan for the patient to arrive to [facility] today for admission.  CSW spoke with the patient nurse Lauren RN and she is aware of the plans for discharge.  RN asked CSW to call [facility] for ETA for transport.  CSW spoke with Elita QuickJose' with after hours transport that reports he is unable to give an ETA but was willing to set up an order for this patient. Elita QuickJose' was informed that the patient is under IVC and sheriff will be accompanying the patient.  CSW asked if the EMS transporter could call before arrival in order for the medical staff to arrange sheriff escort.     Maryelizabeth Rowanressa Holland Nickson, MSW, [place]CSWA, 04/28/2013 Evening Clinical Social Worker [phone]

## 2013-04-29 NOTE — Progress Notes (Addendum)
CSW spoke with pt wife, who states that she is aware that patient was accepted and transferred to [facility]. Pt wife expressed thanks and gratitude for patient care. Patient wife states taht she will be coming to [place] to obtain patient clothes and denchers that were left. CSW to speak with charge nurse to ensure these items are found.   Byrd HesselbachKristen Lakrista Scaduto, LCSW [phone]  ED CSW 04/29/2013 750am   CSW confirmed with RN that patient belongings at nurses station.   Byrd HesselbachKristen Seab Axel, LCSW [phone]  ED CSW 04/29/2013 809am

## 2013-05-17 ENCOUNTER — Emergency Department (HOSPITAL_COMMUNITY)
Admission: EM | Admit: 2013-05-17 | Discharge: 2013-05-17 | Disposition: A | Discharge status: Home / Self Care | Payer: Medicare Other | Attending: Emergency Medicine | Admitting: Emergency Medicine

## 2013-05-17 ENCOUNTER — Encounter (HOSPITAL_COMMUNITY): Payer: Self-pay | Admitting: Emergency Medicine

## 2013-05-17 ENCOUNTER — Emergency Department (HOSPITAL_COMMUNITY): Payer: Medicare Other

## 2013-05-17 DIAGNOSIS — H9190 Unspecified hearing loss, unspecified ear: Secondary | ICD-10-CM | POA: Insufficient documentation

## 2013-05-17 DIAGNOSIS — I10 Essential (primary) hypertension: Secondary | ICD-10-CM | POA: Insufficient documentation

## 2013-05-17 DIAGNOSIS — S01501A Unspecified open wound of lip, initial encounter: Secondary | ICD-10-CM | POA: Insufficient documentation

## 2013-05-17 DIAGNOSIS — F0280 Dementia in other diseases classified elsewhere without behavioral disturbance: Secondary | ICD-10-CM | POA: Insufficient documentation

## 2013-05-17 DIAGNOSIS — G309 Alzheimer's disease, unspecified: Secondary | ICD-10-CM | POA: Insufficient documentation

## 2013-05-17 DIAGNOSIS — S01511A Laceration without foreign body of lip, initial encounter: Secondary | ICD-10-CM

## 2013-05-17 DIAGNOSIS — Z79899 Other long term (current) drug therapy: Secondary | ICD-10-CM | POA: Insufficient documentation

## 2013-05-17 DIAGNOSIS — Z87891 Personal history of nicotine dependence: Secondary | ICD-10-CM | POA: Insufficient documentation

## 2013-05-17 DIAGNOSIS — F329 Major depressive disorder, single episode, unspecified: Secondary | ICD-10-CM | POA: Insufficient documentation

## 2013-05-17 DIAGNOSIS — Y9389 Activity, other specified: Secondary | ICD-10-CM | POA: Insufficient documentation

## 2013-05-17 DIAGNOSIS — F3289 Other specified depressive episodes: Secondary | ICD-10-CM | POA: Insufficient documentation

## 2013-05-17 DIAGNOSIS — Y929 Unspecified place or not applicable: Secondary | ICD-10-CM | POA: Insufficient documentation

## 2013-05-17 DIAGNOSIS — W1809XA Striking against other object with subsequent fall, initial encounter: Secondary | ICD-10-CM | POA: Insufficient documentation

## 2013-05-17 DIAGNOSIS — Z8739 Personal history of other diseases of the musculoskeletal system and connective tissue: Secondary | ICD-10-CM | POA: Insufficient documentation

## 2013-05-17 DIAGNOSIS — W19XXXA Unspecified fall, initial encounter: Secondary | ICD-10-CM

## 2013-05-17 NOTE — ED Notes (Signed)
Per EMS, pt fell from a sitting position onto his knees, hitting his face on the floor. Pt has laceration to upper lip. Pt's nose was also bleeding, but stopped by the time EMS arrived. No loss of conciousness, nursing home staff state pt is behaving at baseline. Pt has hx of dementia and falls. Pt just arrived back from [facility] today. [facility].

## 2013-05-17 NOTE — ED Notes (Signed)
Bed: WA06 Expected date: 05/17/13 Expected time: 7:29 PM Means of arrival: Ambulance Comments: Fall, lip laceration

## 2013-05-17 NOTE — Discharge Instructions (Signed)
Apply ice to the affected are several times a day for 20 minutes at a time. Tylenol as directed for pain. Follow up with PCP. Laceration Care, Adult A laceration is a cut or lesion that goes through all layers of the skin and into the tissue just beneath the skin. TREATMENT  Some lacerations may not require closure. Some lacerations may not be able to be closed due to an increased risk of infection. It is important to see your caregiver as soon as possible after an injury to minimize the risk of infection and maximize the opportunity for successful closure. If closure is appropriate, pain medicines may be given, if needed. The wound will be cleaned to help prevent infection. Your caregiver will use stitches (sutures), staples, wound glue (adhesive), or skin adhesive strips to repair the laceration. These tools bring the skin edges together to allow for faster healing and a better cosmetic outcome. However, all wounds will heal with a scar. Once the wound has healed, scarring can be minimized by covering the wound with sunscreen during the day for 1 full year. HOME CARE INSTRUCTIONS  For sutures or staples:  Keep the wound clean and dry.  If you were given a bandage (dressing), you should change it at least once a day. Also, change the dressing if it becomes wet or dirty, or as directed by your caregiver.  Wash the wound with soap and water 2 times a day. Rinse the wound off with water to remove all soap. Pat the wound dry with a clean towel.  After cleaning, apply a thin layer of the antibiotic ointment as recommended by your caregiver. This will help prevent infection and keep the dressing from sticking.  You may shower as usual after the first 24 hours. Do not soak the wound in water until the sutures are removed.  Only take over-the-counter or prescription medicines for pain, discomfort, or fever as directed by your caregiver.  Get your sutures or staples removed as directed by your  caregiver. For skin adhesive strips:  Keep the wound clean and dry.  Do not get the skin adhesive strips wet. You may bathe carefully, using caution to keep the wound dry.  If the wound gets wet, pat it dry with a clean towel.  Skin adhesive strips will fall off on their own. You may trim the strips as the wound heals. Do not remove skin adhesive strips that are still stuck to the wound. They will fall off in time. For wound adhesive:  You may briefly wet your wound in the shower or bath. Do not soak or scrub the wound. Do not swim. Avoid periods of heavy perspiration until the skin adhesive has fallen off on its own. After showering or bathing, gently pat the wound dry with a clean towel.  Do not apply liquid medicine, cream medicine, or ointment medicine to your wound while the skin adhesive is in place. This may loosen the film before your wound is healed.  If a dressing is placed over the wound, be careful not to apply tape directly over the skin adhesive. This may cause the adhesive to be pulled off before the wound is healed.  Avoid prolonged exposure to sunlight or tanning lamps while the skin adhesive is in place. Exposure to ultraviolet light in the first year will darken the scar.  The skin adhesive will usually remain in place for 5 to 10 days, then naturally fall off the skin. Do not pick at the adhesive film.  You may need a tetanus shot if:  You cannot remember when you had your last tetanus shot.  You have never had a tetanus shot. If you get a tetanus shot, your arm may swell, get red, and feel warm to the touch. This is common and not a problem. If you need a tetanus shot and you choose not to have one, there is a rare chance of getting tetanus. Sickness from tetanus can be serious. SEEK MEDICAL CARE IF:   You have redness, swelling, or increasing pain in the wound.  You see a red line that goes away from the wound.  You have yellowish-white fluid (pus) coming from  the wound.  You have a fever.  You notice a bad smell coming from the wound or dressing.  Your wound breaks open before or after sutures have been removed.  You notice something coming out of the wound such as wood or glass.  Your wound is on your hand or foot and you cannot move a finger or toe. SEEK IMMEDIATE MEDICAL CARE IF:   Your pain is not controlled with prescribed medicine.  You have severe swelling around the wound causing pain and numbness or a change in color in your arm, hand, leg, or foot.  Your wound splits open and starts bleeding.  You have worsening numbness, weakness, or loss of function of any joint around or beyond the wound.  You develop painful lumps near the wound or on the skin anywhere on your body. MAKE SURE YOU:   Understand these instructions.  Will watch your condition.  Will get help right away if you are not doing well or get worse. Document Released: 01/27/2005 Document Revised: 04/21/2011 Document Reviewed: 07/23/2010 [facility] Patient Information 2014 [place], [place].  Open Wound, Lip An open wound is a break in the skin caused by an injury. An open wound to the lip can be a scrape, cut, or hole (puncture). Good wound care will help:   Lessen pain.  Prevent infection.  Lessen scarring. HOME CARE  Wash off all dirt.  Clean your wounds daily with gentle soap and water.  Eat soft foods or liquids while your wound is healing.  Rinse the wound with salt water after each meal.  Apply medicated cream after the wound has been cleaned as told by your doctor.  Follow up with your doctor as told. GET HELP RIGHT AWAY IF:   There is more redness or puffiness (swelling) in or around the wound.  There is increasing pain.  You have a temperature by mouth above 102 F (38.9 C), not controlled by medicine.  Your baby is older than 3 months with a rectal temperature of 102 F (38.9 C) or higher.  Your baby is 73 months old or younger with  a rectal temperature of 100.4 F (38 C) or higher.  There is yellowish white fluid (pus) coming from the wound.  Very bad pain develops that is not controlled with medicine.  There is a red line on the skin above or below the wound. MAKE SURE YOU:   Understand these instructions.  Will watch this condition.  Will get help right away if you are not doing well or get worse. Document Released: 04/25/2008 Document Revised: 04/21/2011 Document Reviewed: 05/15/2009 [facility] Patient Information 2014 [place], [place].

## 2013-05-17 NOTE — ED Provider Notes (Signed)
CSN: [id number]     Arrival date & time 05/17/13  1935 History   First MD Initiated Contact with Patient 05/17/13 2000     Chief Complaint  Patient presents with  . Fall  . Lip Laceration     (Consider location/radiation/quality/duration/timing/severity/associated sxs/prior Treatment) HPI Arthur Dunlap This 73 year old male sent in by his skilled nursing facility for fall.  The patient has a history of multiple falls and has been seen here for the same.  There  Is a level 5 caveat due to dementia.  Per nursing notes and EMS the patient was sitting in a chair when he fell forward onto his knees and hit his face on the floor.  He suffered a small laceration to the back side of his lip and had epistaxis.  Epistaxis resolved before arriving in the emergency department.  He did not lose consciousness and appeared to be at baseline per nursing staff. The patient complains of some swelling and pain in the lip he denies any teeth pain, headache.   Past Medical History  Diagnosis Date  . Dementia   . Depression   . Hypertension   . Foot drop   . Hearing loss   . Alzheimer disease    Past Surgical History  Procedure Laterality Date  . No past surgeries     No family history on file. History  Substance Use Topics  . Smoking status: Former Games developer  . 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 Alcohol Use: No    Review of Systems  Unable to perform ROS     Allergies  Review of patient's allergies indicates no known allergies.  Home Medications   Current Outpatient Rx  Name  Route  Sig  Dispense  Refill  . acetaminophen (TYLENOL) 500 MG tablet   Oral   Take 500 mg by mouth every 4 (four) hours as needed for mild pain, fever or headache.          Marland Kitchen alum & mag hydroxide-simeth (MAALOX/MYLANTA) 200-200-20 MG/5ML suspension   Oral   Take 30 mLs by mouth every 6 (six) hours as needed for indigestion or heartburn.         Marland Kitchen atorvastatin  (LIPITOR) 80 MG tablet   Oral   Take 80 mg by mouth at bedtime.          . enalapril (VASOTEC) 20 MG tablet   Oral   Take 20 mg by mouth every morning.          Marland Kitchen guaiFENesin (ROBITUSSIN) 100 MG/5ML liquid   Oral   Take 200 mg by mouth 3 (three) times daily as needed for cough.         . haloperidol (HALDOL) 1 MG tablet   Oral   Take 1 mg by mouth every 4 (four) hours as needed for agitation.         . hydrochlorothiazide (HYDRODIURIL) 12.5 MG tablet   Oral   Take 12.5 mg by mouth every morning.          . loperamide (IMODIUM) 2 MG capsule   Oral   Take by mouth as needed for diarrhea or loose stools.         Marland Kitchen LORazepam (ATIVAN) 0.5 MG tablet   Oral   Take 0.5 mg by mouth 2 (two) times daily. At 1600 and 2200         . magnesium hydroxide (MILK OF MAGNESIA) 400 MG/5ML  suspension   Oral   Take 30 mLs by mouth daily as needed for mild constipation.         . Melatonin 3 MG TABS   Oral   Take 1 tablet by mouth at bedtime.         . metoprolol (LOPRESSOR) 100 MG tablet   Oral   Take 100 mg by mouth 2 (two) times daily.         Marland Kitchen. valproic acid (DEPAKENE) 250 MG capsule   Oral   Take 500 mg by mouth 3 (three) times daily.           BP 128/64  Pulse 88  Temp(Src) 98 F (36.7 C) (Oral)  Resp 18  SpO2 94% Physical Exam  Nursing note and vitals reviewed. Constitutional: He appears well-developed and well-nourished. No distress.  HENT:  Head: Normocephalic.  Mouth/Throat:    No septal hematoma  Eyes: Conjunctivae and EOM are normal. Pupils are equal, round, and reactive to light. No scleral icterus.  Neck: Normal range of motion. Neck supple.  Cardiovascular: Normal rate, regular rhythm and normal heart sounds.   Pulmonary/Chest: Effort normal and breath sounds normal. No respiratory distress.  Abdominal: Soft. There is no tenderness.  Musculoskeletal: He exhibits no edema.  Neurological: He is alert.  Skin: Skin is warm and dry. He is  not diaphoretic.  Psychiatric: His behavior is normal.    ED Course  Procedures (including critical care time) Labs Review Labs Reviewed - No data to display Imaging Review Ct Head Wo Contrast  05/17/2013   CLINICAL DATA:  Status post fall.  EXAM: CT HEAD WITHOUT CONTRAST  CT MAXILLOFACIAL WITHOUT CONTRAST  TECHNIQUE: Multidetector CT imaging of the head and maxillofacial structures were performed using the standard protocol without intravenous contrast. Multiplanar CT image reconstructions of the maxillofacial structures were also generated.  COMPARISON:  Head CT scan 04/23/2013.  FINDINGS: CT HEAD FINDINGS  There is cortical atrophy and chronic microvascular ischemic change. No evidence of acute intracranial abnormality including hemorrhage, infarct, mass lesion, mass effect, midline shift or abnormal extra-axial fluid collection. No hydrocephalus or pneumocephalus. Calvarium intact.  CT MAXILLOFACIAL FINDINGS  The mandibular condyles are located. No fracture is identified. Mild mucosal thickening is seen in the right maxillary sinus. The globes are intact and the lenses are located. Orbital fat is clear.  IMPRESSION: No acute intracranial abnormality. Atrophy and chronic microvascular ischemic change appears stable.  Negative for facial bone fracture.   Electronically Signed   By: Drusilla Kannerhomas  Dalessio M.D.   On: 05/17/2013 20:55   Ct Maxillofacial Wo Cm  05/17/2013   CLINICAL DATA:  Status post fall.  EXAM: CT HEAD WITHOUT CONTRAST  CT MAXILLOFACIAL WITHOUT CONTRAST  TECHNIQUE: Multidetector CT imaging of the head and maxillofacial structures were performed using the standard protocol without intravenous contrast. Multiplanar CT image reconstructions of the maxillofacial structures were also generated.  COMPARISON:  Head CT scan 04/23/2013.  FINDINGS: CT HEAD FINDINGS  There is cortical atrophy and chronic microvascular ischemic change. No evidence of acute intracranial abnormality including hemorrhage,  infarct, mass lesion, mass effect, midline shift or abnormal extra-axial fluid collection. No hydrocephalus or pneumocephalus. Calvarium intact.  CT MAXILLOFACIAL FINDINGS  The mandibular condyles are located. No fracture is identified. Mild mucosal thickening is seen in the right maxillary sinus. The globes are intact and the lenses are located. Orbital fat is clear.  IMPRESSION: No acute intracranial abnormality. Atrophy and chronic microvascular ischemic change appears stable.  Negative for  facial bone fracture.   Electronically Signed   By: Drusilla Kanner M.D.   On: 05/17/2013 20:55     EKG Interpretation None      MDM   Final diagnoses:  None    Patient with fall, with facial laceration.  There is no evidence of acute abnormality on CT face or CT head. Tetanus Up-to-date and was given and 2014. No laceration repair indicated. Tylenol for pain and ice to affected area. F/u w/ pcp    Arthor Captain, PA-C 05/17/13 2106

## 2013-05-20 NOTE — ED Provider Notes (Signed)
Medical screening examination/treatment/procedure(s) were conducted as a shared visit with non-physician practitioner(s) and myself.  I personally evaluated the patient during the encounter.   EKG Interpretation None     Patient appears to be a baseline.  CT head and CT maxillofacial negative for fracture.  Donnetta HutchingBrian Lannie Heaps, MD 05/20/13 2005

## 2013-06-25 ENCOUNTER — Emergency Department (HOSPITAL_COMMUNITY)
Admission: EM | Admit: 2013-06-25 | Discharge: 2013-06-25 | Disposition: A | Discharge status: Home / Self Care | Payer: Medicare Other | Attending: Emergency Medicine | Admitting: Emergency Medicine

## 2013-06-25 ENCOUNTER — Emergency Department (HOSPITAL_COMMUNITY): Payer: Medicare Other

## 2013-06-25 ENCOUNTER — Encounter (HOSPITAL_COMMUNITY): Payer: Self-pay | Admitting: Emergency Medicine

## 2013-06-25 DIAGNOSIS — F3289 Other specified depressive episodes: Secondary | ICD-10-CM | POA: Insufficient documentation

## 2013-06-25 DIAGNOSIS — H9190 Unspecified hearing loss, unspecified ear: Secondary | ICD-10-CM | POA: Insufficient documentation

## 2013-06-25 DIAGNOSIS — R55 Syncope and collapse: Secondary | ICD-10-CM | POA: Insufficient documentation

## 2013-06-25 DIAGNOSIS — F0280 Dementia in other diseases classified elsewhere without behavioral disturbance: Secondary | ICD-10-CM | POA: Insufficient documentation

## 2013-06-25 DIAGNOSIS — F329 Major depressive disorder, single episode, unspecified: Secondary | ICD-10-CM | POA: Insufficient documentation

## 2013-06-25 DIAGNOSIS — R61 Generalized hyperhidrosis: Secondary | ICD-10-CM | POA: Insufficient documentation

## 2013-06-25 DIAGNOSIS — Z8739 Personal history of other diseases of the musculoskeletal system and connective tissue: Secondary | ICD-10-CM | POA: Insufficient documentation

## 2013-06-25 DIAGNOSIS — I10 Essential (primary) hypertension: Secondary | ICD-10-CM | POA: Insufficient documentation

## 2013-06-25 DIAGNOSIS — G309 Alzheimer's disease, unspecified: Secondary | ICD-10-CM | POA: Insufficient documentation

## 2013-06-25 DIAGNOSIS — Z79899 Other long term (current) drug therapy: Secondary | ICD-10-CM | POA: Insufficient documentation

## 2013-06-25 DIAGNOSIS — Z87891 Personal history of nicotine dependence: Secondary | ICD-10-CM | POA: Insufficient documentation

## 2013-06-25 DIAGNOSIS — E860 Dehydration: Secondary | ICD-10-CM | POA: Insufficient documentation

## 2013-06-25 LAB — CBC WITH DIFFERENTIAL/PLATELET
BASOS ABS: 0 10*3/uL (ref 0.0–0.1)
BASOS PCT: 0 % (ref 0–1)
EOS ABS: 0.1 10*3/uL (ref 0.0–0.7)
EOS PCT: 1 % (ref 0–5)
HEMATOCRIT: 35.8 % — AB (ref 39.0–52.0)
HEMOGLOBIN: 12.2 g/dL — AB (ref 13.0–17.0)
Lymphocytes Relative: 20 % (ref 12–46)
Lymphs Abs: 0.8 10*3/uL (ref 0.7–4.0)
MCH: 30.1 pg (ref 26.0–34.0)
MCHC: 34.1 g/dL (ref 30.0–36.0)
MCV: 88.4 fL (ref 78.0–100.0)
MONO ABS: 0.3 10*3/uL (ref 0.1–1.0)
MONOS PCT: 9 % (ref 3–12)
Neutro Abs: 2.7 10*3/uL (ref 1.7–7.7)
Neutrophils Relative %: 70 % (ref 43–77)
Platelets: 193 10*3/uL (ref 150–400)
RBC: 4.05 MIL/uL — ABNORMAL LOW (ref 4.22–5.81)
RDW: 13.3 % (ref 11.5–15.5)
WBC: 3.9 10*3/uL — ABNORMAL LOW (ref 4.0–10.5)

## 2013-06-25 LAB — URINALYSIS, ROUTINE W REFLEX MICROSCOPIC
Bilirubin Urine: NEGATIVE
Glucose, UA: NEGATIVE mg/dL
HGB URINE DIPSTICK: NEGATIVE
Ketones, ur: NEGATIVE mg/dL
Nitrite: NEGATIVE
Protein, ur: NEGATIVE mg/dL
SPECIFIC GRAVITY, URINE: 1.016 (ref 1.005–1.030)
UROBILINOGEN UA: 1 mg/dL (ref 0.0–1.0)
pH: 8 (ref 5.0–8.0)

## 2013-06-25 LAB — BASIC METABOLIC PANEL
BUN: 13 mg/dL (ref 6–23)
CALCIUM: 9.7 mg/dL (ref 8.4–10.5)
CO2: 27 mEq/L (ref 19–32)
CREATININE: 0.92 mg/dL (ref 0.50–1.35)
Chloride: 105 mEq/L (ref 96–112)
GFR, EST NON AFRICAN AMERICAN: 82 mL/min — AB (ref >90)
Glucose, Bld: 104 mg/dL — ABNORMAL HIGH (ref 70–99)
Potassium: 4.4 mEq/L (ref 3.7–5.3)
Sodium: 144 mEq/L (ref 137–147)

## 2013-06-25 LAB — URINE MICROSCOPIC-ADD ON

## 2013-06-25 LAB — I-STAT TROPONIN, ED
Troponin i, poc: 0 ng/mL (ref 0.00–0.08)
Troponin i, poc: 0.01 ng/mL (ref 0.00–0.08)

## 2013-06-25 LAB — I-STAT CG4 LACTIC ACID, ED: LACTIC ACID, VENOUS: 1.75 mmol/L (ref 0.5–2.2)

## 2013-06-25 LAB — MAGNESIUM: MAGNESIUM: 2 mg/dL (ref 1.5–2.5)

## 2013-06-25 MED ORDER — SODIUM CHLORIDE 0.9 % IV BOLUS (SEPSIS)
1000.0000 mL | Freq: Once | INTRAVENOUS | Status: AC
  Administered 2013-06-25: 1000 mL via INTRAVENOUS

## 2013-06-25 NOTE — ED Notes (Signed)
Patient transported to X-ray 

## 2013-06-25 NOTE — ED Notes (Signed)
Dr ward speaking with family at bedside.

## 2013-06-25 NOTE — Discharge Instructions (Signed)
Dehydration, Elderly Dehydration is when you lose more fluids from the body than you take in. Vital organs such as the kidneys, brain, and heart cannot function without a proper amount of fluids and salt. Any loss of fluids from the body can cause dehydration.  Older adults are at a higher risk of dehydration than younger adults. As we age, our bodies are less able to conserve water and do not respond to temperature changes as well. Also, older adults do not become thirsty as easily or quickly. Because of this, older adults often do not realize they need to increase fluids to avoid dehydration.  CAUSES   Vomiting.  Diarrhea.  Excessive sweating.  Excessive urination.  Fever.  Certain medicines, such as blood pressure medicines called diuretics.  Poorly controlled blood sugars. SIGNS AND SYMPTOMS  Mild dehydration:  Thirst.  Dry lips.  Slightly dry mouth. Moderate dehydration:  Very dry mouth.  Sunken eyes.  Skin does not bounce back quickly when lightly pinched and released.  Dark urine and decreased urine production.  Decreased tear production.  Headache. Severe dehydration:  Very dry mouth.  Extreme thirst.  Rapid, weak pulse (more than 100 beats per minute at rest).  Cold hands and feet.  Not able to sweat in spite of heat.  Rapid breathing.  Blue lips.  Confusion and lethargy.  Difficulty being awakened.  Minimal urine production.  No tears. DIAGNOSIS  Your health care provider will diagnose dehydration based on your symptoms and your exam. Blood and urine tests will help confirm the diagnosis. The diagnostic evaluation should also identify the cause of dehydration. TREATMENT  Treatment of mild or moderate dehydration can often be done at home by increasing the amount of fluids that you drink. It is best to drink small amounts of fluid more often. Drinking too much at one time can make vomiting worse. Severe dehydration needs to be treated at the  hospital. You may be given IV fluids that contain water and electrolytes. HOME CARE INSTRUCTIONS   Ask your health care provider about specific rehydration instructions.  Drink enough fluids to keep your urine clear or pale yellow.  Drink small amounts frequently if you have nausea and vomiting.  Eat as you normally do.  Avoid:  Foods or drinks high in sugar.  Carbonated drinks.  Juice.  Extremely hot or cold fluids.  Drinks with caffeine.  Fatty, greasy foods.  Alcohol.  Tobacco.  Overeating.  Gelatin desserts.  Wash your hands well to avoid spreading bacteria and viruses.  Only take over-the-counter or prescription medicines for pain, discomfort, or fever as directed by your health care provider.  Ask your health care provider if you should continue all prescribed and over-the-counter medicines.  Keep all follow-up appointments with your health care provider. SEEK MEDICAL CARE IF:  You have abdominal pain, and it increases or stays in one area (localizes).  You have a rash, stiff neck, or severe headache.  You are irritable, sleepy, or difficult to awaken.  You are weak, dizzy, or extremely thirsty. SEEK IMMEDIATE MEDICAL CARE IF:   You are unable to keep fluids down, or you get worse despite treatment.  You have frequent episodes of vomiting or diarrhea.  You have blood or green matter (bile) in your vomit.  You have blood in your stool, or your stool looks black and tarry.  You have not urinated in 6 8 hours, or you have only urinated a small amount of very dark urine.  You have a  fever.  You faint. MAKE SURE YOU:   Understand these instructions.  Will watch your condition.  Will get help right away if you are not doing well or get worse. Document Released: 04/19/2003 Document Revised: 11/17/2012 Document Reviewed: 10/04/2012 [facility] Patient Information 2014 [place], [place].  Syncope Syncope is a fainting spell. This means the person  loses consciousness and drops to the ground. The person is generally unconscious for less than 5 minutes. The person may have some muscle twitches for up to 15 seconds before waking up and returning to normal. Syncope occurs more often in elderly people, but it can happen to anyone. While most causes of syncope are not dangerous, syncope can be a sign of a serious medical problem. It is important to seek medical care.  CAUSES  Syncope is caused by a sudden decrease in blood flow to the brain. The specific cause is often not determined. Factors that can trigger syncope include:  Taking medicines that lower blood pressure.  Sudden changes in posture, such as standing up suddenly.  Taking more medicine than prescribed.  Standing in one place for too long.  Seizure disorders.  Dehydration and excessive exposure to heat.  Low blood sugar (hypoglycemia).  Straining to have a bowel movement.  Heart disease, irregular heartbeat, or other circulatory problems.  Fear, emotional distress, seeing blood, or severe pain. SYMPTOMS  Right before fainting, you may:  Feel dizzy or lightheaded.  Feel nauseous.  See all white or all black in your field of vision.  Have cold, clammy skin. DIAGNOSIS  Your caregiver will ask about your symptoms, perform a physical exam, and perform electrocardiography (ECG) to record the electrical activity of your heart. Your caregiver may also perform other heart or blood tests to determine the cause of your syncope. TREATMENT  In most cases, no treatment is needed. Depending on the cause of your syncope, your caregiver may recommend changing or stopping some of your medicines. HOME CARE INSTRUCTIONS  Have someone stay with you until you feel stable.  Do not drive, operate machinery, or play sports until your caregiver says it is okay.  Keep all follow-up appointments as directed by your caregiver.  Lie down right away if you start feeling like you might  faint. Breathe deeply and steadily. Wait until all the symptoms have passed.  Drink enough fluids to keep your urine clear or pale yellow.  If you are taking blood pressure or heart medicine, get up slowly, taking several minutes to sit and then stand. This can reduce dizziness. SEEK IMMEDIATE MEDICAL CARE IF:   You have a severe headache.  You have unusual pain in the chest, abdomen, or back.  You are bleeding from the mouth or rectum, or you have black or tarry stool.  You have an irregular or very fast heartbeat.  You have pain with breathing.  You have repeated fainting or seizure-like jerking during an episode.  You faint when sitting or lying down.  You have confusion.  You have difficulty walking.  You have severe weakness.  You have vision problems. If you fainted, call your local emergency services (911 in [place] Do not drive yourself to the hospital.  MAKE SURE YOU:  Understand these instructions.  Will watch your condition.  Will get help right away if you are not doing well or get worse. Document Released: 01/27/2005 Document Revised: 07/29/2011 Document Reviewed: 03/28/2011 [facility] Patient Information 2014 [place], [place].

## 2013-06-25 NOTE — ED Provider Notes (Signed)
TIME SEEN: 8:56 AM  CHIEF COMPLAINT: Syncopal episode  HPI: Patient is a 73 y.o. M history of dementia, hypertension who presents [facility] the emergency department [facility] with a syncopal event. Per staff, patient was sitting in a chair became less responsive and diaphoretic. No seizure-like activity, and incontinence or tongue biting. Patient was initially hypotensive upon EMS arrival. He has received his lisinopril, hydrochlorothiazide and metoprolol this morning. He has no current complaints. His family is at bedside and states he is at his baseline mental status. No history of head injury.  ROS: Unobtainable secondary to patient's dementia  PAST MEDICAL HISTORY/PAST SURGICAL HISTORY:  Past Medical History  Diagnosis Date  . Dementia   . Depression   . Hypertension   . Foot drop   . Hearing loss   . Alzheimer disease     MEDICATIONS:  Prior to Admission medications   Medication Sig Start Date End Date Taking? Authorizing Provider  acetaminophen (TYLENOL) 500 MG tablet Take 500 mg by mouth every 4 (four) hours as needed for mild pain, fever or headache.     Historical Provider, MD  alum & mag hydroxide-simeth (MAALOX/MYLANTA) 200-200-20 MG/5ML suspension Take 30 mLs by mouth every 6 (six) hours as needed for indigestion or heartburn.    Historical Provider, MD  atorvastatin (LIPITOR) 80 MG tablet Take 80 mg by mouth at bedtime.     Historical Provider, MD  enalapril (VASOTEC) 20 MG tablet Take 20 mg by mouth every morning.     Historical Provider, MD  guaiFENesin (ROBITUSSIN) 100 MG/5ML liquid Take 200 mg by mouth 3 (three) times daily as needed for cough.    Historical Provider, MD  haloperidol (HALDOL) 1 MG tablet Take 1 mg by mouth every 4 (four) hours as needed for agitation.    Historical Provider, MD  hydrochlorothiazide (HYDRODIURIL) 12.5 MG tablet Take 12.5 mg by mouth every morning.     Historical Provider, MD  loperamide (IMODIUM) 2 MG  capsule Take by mouth as needed for diarrhea or loose stools.    Historical Provider, MD  LORazepam (ATIVAN) 0.5 MG tablet Take 0.5 mg by mouth 2 (two) times daily. At 1600 and 2200    Historical Provider, MD  magnesium hydroxide (MILK OF MAGNESIA) 400 MG/5ML suspension Take 30 mLs by mouth daily as needed for mild constipation.    Historical Provider, MD  Melatonin 3 MG TABS Take 1 tablet by mouth at bedtime.    Historical Provider, MD  metoprolol (LOPRESSOR) 100 MG tablet Take 100 mg by mouth 2 (two) times daily.    Historical Provider, MD  valproic acid (DEPAKENE) 250 MG capsule Take 500 mg by mouth 3 (three) times daily.     Historical Provider, MD    ALLERGIES:  No Known Allergies  SOCIAL HISTORY:  History  Substance Use Topics  . Smoking status: Former Games developermoker  . 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 Alcohol Use: No    FAMILY HISTORY: History reviewed. No pertinent family history.  EXAM: BP 105/60  Pulse 59  Temp(Src) 97.4 F (36.3 C) (Oral)  Resp 18  SpO2 95% CONSTITUTIONAL: Alert and oriented to person and place and responds appropriately to questions intermittently. Well-appearing; well-nourished, no apparent distress, smiling and pleasant HEAD: Normocephalic EYES: Conjunctivae clear, PERRL ENT: normal nose; no rhinorrhea; moist mucous membranes; pharynx without lesions noted NECK: Supple, no meningismus, no LAD  CARD: RRR; S1  and S2 appreciated; no murmurs, no clicks, no rubs, no gallops RESP: Normal chest excursion without splinting or tachypnea; breath sounds clear and equal bilaterally; no wheezes, no rhonchi, no rales,  ABD/GI: Normal bowel sounds; non-distended; soft, non-tender, no rebound, no guarding BACK:  The back appears normal and is non-tender to palpation, there is no CVA tenderness EXT: Normal ROM in all joints; non-tender to palpation; no edema; normal capillary refill; no cyanosis    SKIN: Normal color for age and  race; warm NEURO: Moves all extremities equally, follows commands intermittently, cranial nerves II through XII intact PSYCH: The patient's mood and manner are appropriate. Grooming and personal hygiene are appropriate.  MEDICAL DECISION MAKING: Patient here with a syncopal event. He is mildly hypertensive in the emergency department. We'll give IV fluids. His blood glucose was 166 with EMS. Discussed with family history patient is a DO NOT RESUSCITATE/DO NOT INTUBATE but they would want full workup and possible admission if needed. We'll obtain cardiac labs, urine, chest x-ray and continue to closely monitor.  ED PROGRESS: Patient's blood pressure has been improving with IV fluids. His troponin is negative. Other labs are unremarkable. Urine shows small leukocytes bacteria but also squamous cells. We'll obtain urine culture. Chest x-ray shows no new infiltrate or edema. Will repeat second troponin and discuss with family.   12:08 PM  D/w pt's wife at bedside who reports patient would not want extreme measures or intervention including cardiac catheterization, surgery if needed. She does not think that he would want admission to the hospital at this time. She agrees with repeat troponin and then discharge home.   12:42 PM  Pt's second troponin is negative and he is still at his neurologic baseline with no complaints, pleasant, smiling. His blood pressure has improved with IV fluids. Discussed supportive care instructions and return precautions. Family is comfortable with discharge back to his nursing facility.   EKG Interpretation  Date/Time:  Saturday Jun 25 2013 08:45:38 EDT Ventricular Rate:  57 PR Interval:  213 QRS Duration: 115 QT Interval:  490 QTC Calculation: 477 R Axis:   31 Text Interpretation:  Sinus rhythm Borderline prolonged PR interval Nonspecific intraventricular conduction delay No significant change since last tracing Confirmed by WARD,  DO, KRISTEN [phone]([id number]) on 06/25/2013  9:04:20 AM        Layla MawKristen N Ward, DO 06/25/13 1243

## 2013-06-25 NOTE — ED Notes (Signed)
[facility] notified to pick up patient.

## 2013-06-25 NOTE — ED Notes (Signed)
Pt arrived by [facility] from [facility]. Staff reports pt was up walking this am, at his baseline. Pt sat down in a chair, staff reported pt became less responsive and diaphoretic. On ems arrival, pt was pale, diaphoretic, unable to palpate radial pulse when pt in sitting position.  Initially sbp was 88, last bp pta was 107/57. Pt was placed in trendelenburg and had increase in mental status. HR 50 pta, cbg 166. Awake and answering questions on arrival to ed.

## 2013-06-27 LAB — URINE CULTURE
Colony Count: NO GROWTH
Culture: NO GROWTH

## 2013-07-03 ENCOUNTER — Emergency Department (HOSPITAL_COMMUNITY): Payer: Medicare Other

## 2013-07-03 ENCOUNTER — Encounter (HOSPITAL_COMMUNITY): Payer: Self-pay | Admitting: Emergency Medicine

## 2013-07-03 ENCOUNTER — Emergency Department (HOSPITAL_COMMUNITY)
Admission: EM | Admit: 2013-07-03 | Discharge: 2013-07-03 | Disposition: A | Discharge status: Skilled Nursing Facility | Payer: Medicare Other | Attending: Emergency Medicine | Admitting: Emergency Medicine

## 2013-07-03 DIAGNOSIS — F0280 Dementia in other diseases classified elsewhere without behavioral disturbance: Secondary | ICD-10-CM | POA: Insufficient documentation

## 2013-07-03 DIAGNOSIS — Z79899 Other long term (current) drug therapy: Secondary | ICD-10-CM | POA: Insufficient documentation

## 2013-07-03 DIAGNOSIS — R296 Repeated falls: Secondary | ICD-10-CM | POA: Insufficient documentation

## 2013-07-03 DIAGNOSIS — F329 Major depressive disorder, single episode, unspecified: Secondary | ICD-10-CM | POA: Insufficient documentation

## 2013-07-03 DIAGNOSIS — I10 Essential (primary) hypertension: Secondary | ICD-10-CM | POA: Insufficient documentation

## 2013-07-03 DIAGNOSIS — F0390 Unspecified dementia without behavioral disturbance: Secondary | ICD-10-CM | POA: Insufficient documentation

## 2013-07-03 DIAGNOSIS — Y939 Activity, unspecified: Secondary | ICD-10-CM | POA: Insufficient documentation

## 2013-07-03 DIAGNOSIS — G309 Alzheimer's disease, unspecified: Secondary | ICD-10-CM | POA: Insufficient documentation

## 2013-07-03 DIAGNOSIS — W19XXXA Unspecified fall, initial encounter: Secondary | ICD-10-CM

## 2013-07-03 DIAGNOSIS — M216X9 Other acquired deformities of unspecified foot: Secondary | ICD-10-CM | POA: Insufficient documentation

## 2013-07-03 DIAGNOSIS — Z87891 Personal history of nicotine dependence: Secondary | ICD-10-CM | POA: Insufficient documentation

## 2013-07-03 DIAGNOSIS — Y9210 Unspecified residential institution as the place of occurrence of the external cause: Secondary | ICD-10-CM | POA: Insufficient documentation

## 2013-07-03 DIAGNOSIS — F3289 Other specified depressive episodes: Secondary | ICD-10-CM | POA: Insufficient documentation

## 2013-07-03 MED ORDER — FENTANYL CITRATE 0.05 MG/ML IJ SOLN: 50.0000 ug | Freq: Once | INTRAMUSCULAR | Status: DC

## 2013-07-03 NOTE — ED Notes (Signed)
[facility] presents with a 73 yo male from [facility] facility with a fall with no obvious deformity.  Pt was found by staff on bathroom floor face down from a assumed unwitnessed fall.  No reported LOC.  Pt was ambulatory on scene.  Some facial swelling.  CBG 95 mg/dl

## 2013-07-03 NOTE — ED Provider Notes (Signed)
CSN: [id number]     Arrival date & time 07/03/13  1237 History   First MD Initiated Contact with Patient 07/03/13 1238     Chief Complaint  Patient presents with  . Fall     (Consider location/radiation/quality/duration/timing/severity/associated sxs/prior Treatment) The history is provided by the patient, the nursing home and the EMS personnel. The history is limited by the condition of the patient.  Arthur CrewsDavid Dunlap is a 73 y.o. male hx of dementia, HTN, here with fall. He resides at [facility]. He is in the dementia unit. However, he was noted to be on the floor when the staff found him in his room. He then subsequently walked to with staff to the activity room. EMS was called and he was brought to the ER. As per wife he has been falling often despite being in the nursing home. She is trying to place him to another facility. He was seen here several days ago for dizziness and had normal labs as well as normal urine culture and chest x-ray showed some atelectasis.    Level V caveat- dementia   Past Medical History  Diagnosis Date  . Dementia   . Depression   . Hypertension   . Foot drop   . Hearing loss   . Alzheimer disease    Past Surgical History  Procedure Laterality Date  . No past surgeries     History reviewed. No pertinent family history. History  Substance Use Topics  . Smoking status: Former Games developermoker  . 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 Alcohol Use: No    Review of Systems  Unable to perform ROS: Dementia      Allergies  Review of patient's allergies indicates no known allergies.  Home Medications   Prior to Admission medications   Medication Sig Start Date End Date Taking? Authorizing Provider  acetaminophen (TYLENOL) 500 MG tablet Take 500 mg by mouth every 4 (four) hours as needed for mild pain, fever or headache.    Yes Historical Provider, MD  alum & mag hydroxide-simeth (MAALOX/MYLANTA)  200-200-20 MG/5ML suspension Take 30 mLs by mouth every 6 (six) hours as needed for indigestion or heartburn.   Yes Historical Provider, MD  guaiFENesin (ROBITUSSIN) 100 MG/5ML liquid Take 200 mg by mouth 3 (three) times daily as needed for cough.   Yes Historical Provider, MD  hydrochlorothiazide (HYDRODIURIL) 12.5 MG tablet Take 12.5 mg by mouth every morning.    Yes Historical Provider, MD  lisinopril (PRINIVIL,ZESTRIL) 20 MG tablet Take 20 mg by mouth daily.   Yes Historical Provider, MD  magnesium hydroxide (MILK OF MAGNESIA) 400 MG/5ML suspension Take 30 mLs by mouth daily as needed for mild constipation.   Yes Historical Provider, MD  metoprolol (LOPRESSOR) 50 MG tablet Take 50 mg by mouth 2 (two) times daily.   Yes Historical Provider, MD  risperiDONE (RISPERDAL) 0.5 MG tablet Take 0.5 mg by mouth 2 (two) times daily.   Yes Historical Provider, MD  valproic acid (DEPAKENE) 250 MG capsule Take 250 mg by mouth 3 (three) times daily.    Yes Historical Provider, MD   BP 131/77  Pulse 72  Temp(Src) 98.3 F (36.8 C) (Oral)  Resp 14  SpO2 98% Physical Exam  Nursing note and vitals reviewed. Constitutional:  Chronically ill  HENT:  Head: Normocephalic.  Mouth/Throat: Oropharynx is clear and moist.  No obvious hematoma. ? R facial swelling but no bony tenderness  or deformity. Jaw nontender.   Eyes: Conjunctivae are normal. Pupils are equal, round, and reactive to light.  Neck: Normal range of motion. Neck supple.  Cardiovascular: Normal rate, regular rhythm, normal heart sounds and intact distal pulses.   Pulmonary/Chest: Effort normal and breath sounds normal. No respiratory distress. He has no wheezes. He has no rales.  Abdominal: Soft. Bowel sounds are normal. He exhibits no distension. There is no tenderness. There is no rebound and no guarding.  Musculoskeletal: Normal range of motion. He exhibits no edema and no tenderness.  No obvious extremity trauma. Pelvis stable    Neurological: He is alert.  Demented, moving all extremities   Skin: Skin is warm and dry.  Psychiatric:  Unable     ED Course  Procedures (including critical care time) Labs Review Labs Reviewed - No data to display  Imaging Review Ct Head Wo Contrast  07/03/2013   CLINICAL DATA:  Fall.  EXAM: CT HEAD WITHOUT CONTRAST  TECHNIQUE: Contiguous axial images were obtained from the base of the skull through the vertex without intravenous contrast.  COMPARISON:  CT scan of May 17, 2013.  FINDINGS: Bony calvarium appears intact. Mild diffuse cortical atrophy is noted. Minimal chronic ischemic white matter disease is noted. No mass effect or midline shift is noted. Ventricular size is within normal limits. There is no evidence of mass lesion, hemorrhage or acute infarction.  IMPRESSION: Mild diffuse cortical atrophy. Minimal diffuse cortical atrophy. No acute intracranial abnormality seen.   Electronically Signed   By: Roque Lias M.D.   On: 07/03/2013 13:55     EKG Interpretation   Date/Time:  Sunday Jul 03 2013 12:47:51 EDT Ventricular Rate:  76 PR Interval:    QRS Duration: 104 QT Interval:  412 QTC Calculation: 463 R Axis:   -32 Text Interpretation:  Normal sinus rhythm Left axis deviation No  significant change since last tracing Confirmed by Shameika Speelman  MD, Jermario ([id number])  on 07/03/2013 12:54:25 PM      MDM   Final diagnoses:  None   Arthur Dunlap is a 73 y.o. male here with unwitnessed fall. He is DNR and had nl labs several days ago. Will not repeat labs. Will get CT head and repeat EKG.   3:12 PM CT head unremarkable. EKG unremarkable. Case manager talked to wife regarding options. Will d/c back to nursing home for now.   Richardean Canal, MD 07/03/13 [phone]

## 2013-07-03 NOTE — ED Notes (Signed)
Attempted to call report several times with no success; waiting for [facility] arrival

## 2013-07-03 NOTE — Discharge Instructions (Signed)
He needs to be on fall precaution and have someone watch him walk so he doesn't fall.   Follow up with your doctor.   Return to ER if he falls, passing out.

## 2013-08-10 ENCOUNTER — Encounter (HOSPITAL_COMMUNITY): Payer: Self-pay | Admitting: Emergency Medicine

## 2013-08-10 ENCOUNTER — Emergency Department (HOSPITAL_COMMUNITY)
Admission: EM | Admit: 2013-08-10 | Discharge: 2013-08-10 | Disposition: A | Discharge status: Home / Self Care | Payer: Medicare Other | Attending: Emergency Medicine | Admitting: Emergency Medicine

## 2013-08-10 DIAGNOSIS — W1809XA Striking against other object with subsequent fall, initial encounter: Secondary | ICD-10-CM | POA: Insufficient documentation

## 2013-08-10 DIAGNOSIS — F329 Major depressive disorder, single episode, unspecified: Secondary | ICD-10-CM | POA: Insufficient documentation

## 2013-08-10 DIAGNOSIS — I10 Essential (primary) hypertension: Secondary | ICD-10-CM | POA: Insufficient documentation

## 2013-08-10 DIAGNOSIS — F0280 Dementia in other diseases classified elsewhere without behavioral disturbance: Secondary | ICD-10-CM | POA: Insufficient documentation

## 2013-08-10 DIAGNOSIS — Y9389 Activity, other specified: Secondary | ICD-10-CM | POA: Insufficient documentation

## 2013-08-10 DIAGNOSIS — Z87891 Personal history of nicotine dependence: Secondary | ICD-10-CM | POA: Insufficient documentation

## 2013-08-10 DIAGNOSIS — Z043 Encounter for examination and observation following other accident: Secondary | ICD-10-CM | POA: Insufficient documentation

## 2013-08-10 DIAGNOSIS — Z8739 Personal history of other diseases of the musculoskeletal system and connective tissue: Secondary | ICD-10-CM | POA: Insufficient documentation

## 2013-08-10 DIAGNOSIS — Z79899 Other long term (current) drug therapy: Secondary | ICD-10-CM | POA: Insufficient documentation

## 2013-08-10 DIAGNOSIS — Y9210 Unspecified residential institution as the place of occurrence of the external cause: Secondary | ICD-10-CM | POA: Insufficient documentation

## 2013-08-10 DIAGNOSIS — G309 Alzheimer's disease, unspecified: Secondary | ICD-10-CM | POA: Insufficient documentation

## 2013-08-10 DIAGNOSIS — F3289 Other specified depressive episodes: Secondary | ICD-10-CM | POA: Insufficient documentation

## 2013-08-10 DIAGNOSIS — W19XXXA Unspecified fall, initial encounter: Secondary | ICD-10-CM

## 2013-08-10 NOTE — ED Provider Notes (Signed)
CSN: [id number]     Arrival date & time 08/10/13  1730 History   First MD Initiated Contact with Patient 08/10/13 1737     Chief Complaint  Patient presents with  . Fall  . Head Injury     (Consider location/radiation/quality/duration/timing/severity/associated sxs/prior Treatment) Patient is a 73 y.o. male presenting with fall and head injury. The history is provided by the patient and the EMS personnel. The history is limited by the condition of the patient.  Fall  Head Injury  patient here after having a witnessed fall the nursing home when he was being assisted to a seated position. He fell and struck the back of his head. No loss of consciousness noted. Does not take any anticoagulants. No evidence of head trauma. Use as neurological baseline at this time. Does have history of dementia. He denies any complaints of neck or head pain. No hip pain. No chest wall discomfort. He was transported by EMS  Past Medical History  Diagnosis Date  . Dementia   . Depression   . Hypertension   . Foot drop   . Hearing loss   . Alzheimer disease    Past Surgical History  Procedure Laterality Date  . No past surgeries     History reviewed. No pertinent family history. History  Substance Use Topics  . Smoking status: Former Games developermoker  . 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 Alcohol Use: No    Review of Systems  Unable to perform ROS     Allergies  Review of patient's allergies indicates no known allergies.  Home Medications   Prior to Admission medications   Medication Sig Start Date End Date Taking? Authorizing Provider  acetaminophen (TYLENOL) 500 MG tablet Take 500 mg by mouth every 4 (four) hours as needed for mild pain, fever or headache.    Yes Historical Provider, MD  alum & mag hydroxide-simeth (MAALOX/MYLANTA) 200-200-20 MG/5ML suspension Take 30 mLs by mouth every 6 (six) hours as needed for indigestion or heartburn.   Yes Historical  Provider, MD  guaiFENesin (ROBITUSSIN) 100 MG/5ML liquid Take 200 mg by mouth 3 (three) times daily as needed for cough.   Yes Historical Provider, MD  hydrochlorothiazide (HYDRODIURIL) 25 MG tablet Take 25 mg by mouth daily.   Yes Historical Provider, MD  lisinopril (PRINIVIL,ZESTRIL) 20 MG tablet Take 20 mg by mouth daily.   Yes Historical Provider, MD  magnesium hydroxide (MILK OF MAGNESIA) 400 MG/5ML suspension Take 30 mLs by mouth daily as needed for mild constipation.   Yes Historical Provider, MD  metoprolol (LOPRESSOR) 50 MG tablet Take 50 mg by mouth 2 (two) times daily.   Yes Historical Provider, MD  risperiDONE (RISPERDAL) 0.5 MG tablet Take 0.5 mg by mouth 2 (two) times daily.   Yes Historical Provider, MD  valproic acid (DEPAKENE) 250 MG capsule Take 250 mg by mouth 3 (three) times daily.    Yes Historical Provider, MD   BP 119/61  Pulse 66  Temp(Src) 98.3 F (36.8 C) (Oral)  Resp 14  SpO2 98% Physical Exam  Nursing note and vitals reviewed. Constitutional: He appears well-developed and well-nourished.  Non-toxic appearance. No distress.  HENT:  Head: Normocephalic. Head is without abrasion and without contusion.  Eyes: Conjunctivae, EOM and lids are normal. Pupils are equal, round, and reactive to light.  Neck: Normal range of motion. Neck supple. No tracheal deviation present. No mass present.  Cardiovascular: Normal rate, regular  rhythm and normal heart sounds.  Exam reveals no gallop.   No murmur heard. Pulmonary/Chest: Effort normal and breath sounds normal. No stridor. No respiratory distress. He has no decreased breath sounds. He has no wheezes. He has no rhonchi. He has no rales.  Abdominal: Soft. Normal appearance and bowel sounds are normal. He exhibits no distension. There is no tenderness. There is no rebound and no CVA tenderness.  Musculoskeletal: Normal range of motion. He exhibits no edema and no tenderness.  Patient has full range of motion at both hips  bilaterally. Nontender along his cervical thoracic and lumbar spine. No evidence of trauma noted.  Neurological: He is alert. He has normal strength. No cranial nerve deficit or sensory deficit. GCS eye subscore is 4. GCS verbal subscore is 5. GCS motor subscore is 6.  Skin: Skin is warm and dry. No abrasion and no rash noted.  Psychiatric: His affect is blunt. His speech is delayed. He is withdrawn.    ED Course  Procedures (including critical care time) Labs Review Labs Reviewed - No data to display  Imaging Review No results found.   EKG Interpretation None      MDM   Final diagnoses:  None    Patient has no obvious signs of trauma at this time. Do not feel that imaging is indicated. We'll discharge back to nursing home    Toy BakerAnthony T Teon Hudnall, MD 08/10/13 1811

## 2013-08-10 NOTE — Discharge Instructions (Signed)
Return here for decreased level of consciousness, vomiting, or any other problems

## 2013-08-10 NOTE — ED Notes (Signed)
Per [facility]- PT IS DNR YELLOW COPY PRESET. Pt resides at [facility]. Per staff witnessed fall pt was being assisted to a seated position when pt fell back striking posterior head. No LOC. Denies NVD, fever. No obvious injuries. Pt on no anticoagulant therapy. No other complaints/injuries noted. Hx of dementia per staff at baseline.

## 2013-08-10 NOTE — ED Notes (Signed)
Bed: WA07 Expected date:  Expected time:  Means of arrival:  Comments: ems- elderly fall

## 2013-08-28 ENCOUNTER — Encounter (HOSPITAL_COMMUNITY): Payer: Self-pay | Admitting: Emergency Medicine

## 2013-08-28 ENCOUNTER — Emergency Department (HOSPITAL_COMMUNITY)
Admission: EM | Admit: 2013-08-28 | Discharge: 2013-08-28 | Disposition: A | Discharge status: Home / Self Care | Payer: Medicare Other | Attending: Emergency Medicine | Admitting: Emergency Medicine

## 2013-08-28 DIAGNOSIS — G309 Alzheimer's disease, unspecified: Secondary | ICD-10-CM | POA: Insufficient documentation

## 2013-08-28 DIAGNOSIS — F3289 Other specified depressive episodes: Secondary | ICD-10-CM | POA: Diagnosis not present

## 2013-08-28 DIAGNOSIS — Z043 Encounter for examination and observation following other accident: Secondary | ICD-10-CM | POA: Insufficient documentation

## 2013-08-28 DIAGNOSIS — I10 Essential (primary) hypertension: Secondary | ICD-10-CM | POA: Insufficient documentation

## 2013-08-28 DIAGNOSIS — Y929 Unspecified place or not applicable: Secondary | ICD-10-CM | POA: Diagnosis not present

## 2013-08-28 DIAGNOSIS — F329 Major depressive disorder, single episode, unspecified: Secondary | ICD-10-CM | POA: Insufficient documentation

## 2013-08-28 DIAGNOSIS — Z87891 Personal history of nicotine dependence: Secondary | ICD-10-CM | POA: Diagnosis not present

## 2013-08-28 DIAGNOSIS — W19XXXA Unspecified fall, initial encounter: Secondary | ICD-10-CM | POA: Diagnosis not present

## 2013-08-28 DIAGNOSIS — F0280 Dementia in other diseases classified elsewhere without behavioral disturbance: Secondary | ICD-10-CM | POA: Insufficient documentation

## 2013-08-28 DIAGNOSIS — H9190 Unspecified hearing loss, unspecified ear: Secondary | ICD-10-CM | POA: Insufficient documentation

## 2013-08-28 DIAGNOSIS — Z79899 Other long term (current) drug therapy: Secondary | ICD-10-CM | POA: Insufficient documentation

## 2013-08-28 DIAGNOSIS — Y939 Activity, unspecified: Secondary | ICD-10-CM | POA: Insufficient documentation

## 2013-08-28 LAB — URINALYSIS, ROUTINE W REFLEX MICROSCOPIC
Bilirubin Urine: NEGATIVE
Glucose, UA: NEGATIVE mg/dL
Hgb urine dipstick: NEGATIVE
Ketones, ur: NEGATIVE mg/dL
Leukocytes, UA: NEGATIVE
Nitrite: NEGATIVE
Protein, ur: NEGATIVE mg/dL
Specific Gravity, Urine: 1.017 (ref 1.005–1.030)
Urobilinogen, UA: 1 mg/dL (ref 0.0–1.0)
pH: 7 (ref 5.0–8.0)

## 2013-08-28 NOTE — ED Notes (Signed)
Bed: WA03 Expected date:  Expected time:  Means of arrival:  Comments: EMS C.H.I. asymptomatic

## 2013-08-28 NOTE — ED Notes (Addendum)
[facility]-Per EMS, pt had un witnessed fall today. Upon arrival, EMS stated that pt was sitting on floor. Pt has no obvious injury, bruising or other injury. Pt C-spine was cleared on scene. Pt denies pain or injury. Pt sent from facility per policy to be checked after a fall. Pt is oriented to self only d/t hx of dementia.  Pt is DNR

## 2013-08-28 NOTE — Discharge Instructions (Signed)

## 2013-08-28 NOTE — ED Provider Notes (Signed)
CSN: [id number]     Arrival date & time 08/28/13  1617 History   First MD Initiated Contact with Patient 08/28/13 1652     Chief Complaint  Patient presents with  . Fall     (Consider location/radiation/quality/duration/timing/severity/associated sxs/prior Treatment) HPI  73yM sent from facility per protocol after fall. Happened shortly before arrival. Pt reports "I went down" but unable to tell me the circumstances of this. Found sitting on floor. He denies pain anywhere. No acute visual changes. No nausea. No respiratory complaints. No blood thinners per med list.   Past Medical History  Diagnosis Date  . Dementia   . Depression   . Hypertension   . Foot drop   . Hearing loss   . Alzheimer disease    Past Surgical History  Procedure Laterality Date  . No past surgeries     No family history on file. History  Substance Use Topics  . Smoking status: Former Games developermoker  . 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 Alcohol Use: No    Review of Systems  All systems reviewed and negative, other than as noted in HPI.   Allergies  Review of patient's allergies indicates no known allergies.  Home Medications   Prior to Admission medications   Medication Sig Start Date End Date Taking? Authorizing Provider  acetaminophen (TYLENOL) 500 MG tablet Take 500 mg by mouth every 4 (four) hours as needed for mild pain, fever or headache.    Yes Historical Provider, MD  alum & mag hydroxide-simeth (MAALOX/MYLANTA) 200-200-20 MG/5ML suspension Take 30 mLs by mouth every 6 (six) hours as needed for indigestion or heartburn.   Yes Historical Provider, MD  guaiFENesin (ROBITUSSIN) 100 MG/5ML liquid Take 200 mg by mouth 3 (three) times daily as needed for cough.   Yes Historical Provider, MD  hydrochlorothiazide (HYDRODIURIL) 25 MG tablet Take 25 mg by mouth daily.   Yes Historical Provider, MD  lisinopril (PRINIVIL,ZESTRIL) 20 MG tablet Take 20 mg by mouth  daily.   Yes Historical Provider, MD  magnesium hydroxide (MILK OF MAGNESIA) 400 MG/5ML suspension Take 30 mLs by mouth daily as needed for mild constipation.   Yes Historical Provider, MD  metoprolol (LOPRESSOR) 50 MG tablet Take 50 mg by mouth 2 (two) times daily.   Yes Historical Provider, MD  risperiDONE (RISPERDAL) 0.5 MG tablet Take 0.5 mg by mouth 2 (two) times daily.   Yes Historical Provider, MD  valproic acid (DEPAKENE) 250 MG capsule Take 250 mg by mouth 3 (three) times daily.    Yes Historical Provider, MD   BP 133/61  Pulse 65  Temp(Src) 98 F (36.7 C) (Oral)  Resp 18  SpO2 98% Physical Exam  Nursing note and vitals reviewed. Constitutional: He appears well-developed and well-nourished. No distress.  HENT:  Head: Normocephalic and atraumatic.  Eyes: Conjunctivae are normal. Pupils are equal, round, and reactive to light. Right eye exhibits no discharge. Left eye exhibits no discharge.  Neck: Neck supple.  Cardiovascular: Normal rate, regular rhythm and normal heart sounds.  Exam reveals no gallop and no friction rub.   No murmur heard. Pulmonary/Chest: Effort normal and breath sounds normal. No respiratory distress.  Abdominal: Soft. He exhibits no distension. There is no tenderness.  Musculoskeletal: He exhibits no edema and no tenderness.  No pain with ROM of large joints or bony tenderness of extremities on palpation. No midline spinal tenderness.   Neurological: He is alert. No  cranial nerve deficit. He exhibits normal muscle tone.  Speech slow/deliberate but not dysarthric. Follows commands. Responses appropriate but sometimes loses train of thought. CN intact. Strength 5/5 b/l u/l extremities.   Skin: Skin is warm and dry. He is not diaphoretic.  Psychiatric: He has a normal mood and affect. His behavior is normal. Thought content normal.    ED Course  Procedures (including critical care time) Labs Review Labs Reviewed  URINALYSIS, ROUTINE W REFLEX MICROSCOPIC     Imaging Review No results found.   EKG Interpretation None      MDM   Final diagnoses:  Fall, initial encounter    73yM presenting after a fall. Seems to be sent per facility protocol. He has no complaints. Does have history of dementia but answers most questions fairly appropriately. He denies pain. No external signs of trauma. Moving all extremities w/o pain. No midline tenderness. Non focal neuro exam aside from deliberate speech and some mild confusion which seems to be his baseline. I don't feel he needs any imaging simply because of his age and he had a fall. I feel he is reliable enough to give me an adequate ROS. Will discharge back to facility.     Raeford Razor, MD 08/28/13 [phone]

## 2013-08-30 ENCOUNTER — Emergency Department (HOSPITAL_COMMUNITY): Payer: Medicare Other

## 2013-08-30 ENCOUNTER — Emergency Department (HOSPITAL_COMMUNITY)
Admission: EM | Admit: 2013-08-30 | Discharge: 2013-08-31 | Disposition: A | Discharge status: Home / Self Care | Payer: Medicare Other | Attending: Emergency Medicine | Admitting: Emergency Medicine

## 2013-08-30 ENCOUNTER — Encounter (HOSPITAL_COMMUNITY): Payer: Self-pay | Admitting: Emergency Medicine

## 2013-08-30 DIAGNOSIS — Z79899 Other long term (current) drug therapy: Secondary | ICD-10-CM | POA: Insufficient documentation

## 2013-08-30 DIAGNOSIS — W050XXA Fall from non-moving wheelchair, initial encounter: Secondary | ICD-10-CM | POA: Diagnosis not present

## 2013-08-30 DIAGNOSIS — Z043 Encounter for examination and observation following other accident: Secondary | ICD-10-CM | POA: Insufficient documentation

## 2013-08-30 DIAGNOSIS — Z8739 Personal history of other diseases of the musculoskeletal system and connective tissue: Secondary | ICD-10-CM | POA: Diagnosis not present

## 2013-08-30 DIAGNOSIS — Z87891 Personal history of nicotine dependence: Secondary | ICD-10-CM | POA: Diagnosis not present

## 2013-08-30 DIAGNOSIS — Y9210 Unspecified residential institution as the place of occurrence of the external cause: Secondary | ICD-10-CM | POA: Insufficient documentation

## 2013-08-30 DIAGNOSIS — G309 Alzheimer's disease, unspecified: Secondary | ICD-10-CM | POA: Diagnosis not present

## 2013-08-30 DIAGNOSIS — I10 Essential (primary) hypertension: Secondary | ICD-10-CM | POA: Insufficient documentation

## 2013-08-30 DIAGNOSIS — F329 Major depressive disorder, single episode, unspecified: Secondary | ICD-10-CM | POA: Diagnosis not present

## 2013-08-30 DIAGNOSIS — W1809XA Striking against other object with subsequent fall, initial encounter: Secondary | ICD-10-CM | POA: Diagnosis not present

## 2013-08-30 DIAGNOSIS — Y9389 Activity, other specified: Secondary | ICD-10-CM | POA: Diagnosis not present

## 2013-08-30 DIAGNOSIS — F0280 Dementia in other diseases classified elsewhere without behavioral disturbance: Secondary | ICD-10-CM | POA: Insufficient documentation

## 2013-08-30 DIAGNOSIS — F3289 Other specified depressive episodes: Secondary | ICD-10-CM | POA: Insufficient documentation

## 2013-08-30 DIAGNOSIS — W19XXXA Unspecified fall, initial encounter: Secondary | ICD-10-CM

## 2013-08-30 NOTE — ED Provider Notes (Signed)
CSN: [id number]     Arrival date & time 08/30/13  1926 History   First MD Initiated Contact with Patient 08/30/13 1939     Chief Complaint  Patient presents with  . Fall     (Consider location/radiation/quality/duration/timing/severity/associated sxs/prior Treatment) HPI Comments: Patient is a 73 year old male with history of dementia, depression, hypertension who presents today after a fall. He was at [facility] nursing home facility when he had a witnessed fall. He attempted to get out of his wheelchair and lost his balance. He hit his head on the wall. There was no loss of consciousness. The patient currently complains of no pain.  History is limited due to patient's dementia. It has not seemed to be external signs of trauma. Per EMS the patient is at his baseline.   The history is provided by the patient. No language interpreter was used.    Past Medical History  Diagnosis Date  . Dementia   . Depression   . Hypertension   . Foot drop   . Hearing loss   . Alzheimer disease    Past Surgical History  Procedure Laterality Date  . No past surgeries     History reviewed. No pertinent family history. History  Substance Use Topics  . Smoking status: Former Games developer  . 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 Alcohol Use: No    Review of Systems  Unable to perform ROS: Dementia      Allergies  Review of patient's allergies indicates no known allergies.  Home Medications   Prior to Admission medications   Medication Sig Start Date End Date Taking? Authorizing Provider  acetaminophen (TYLENOL) 500 MG tablet Take 500 mg by mouth every 4 (four) hours as needed for mild pain, fever or headache.    Yes Historical Provider, MD  alum & mag hydroxide-simeth (MAALOX/MYLANTA) 200-200-20 MG/5ML suspension Take 30 mLs by mouth every 6 (six) hours as needed for indigestion or heartburn.   Yes Historical Provider, MD  guaiFENesin (ROBITUSSIN) 100  MG/5ML liquid Take 200 mg by mouth 3 (three) times daily as needed for cough.   Yes Historical Provider, MD  hydrochlorothiazide (HYDRODIURIL) 25 MG tablet Take 25 mg by mouth daily.   Yes Historical Provider, MD  lisinopril (PRINIVIL,ZESTRIL) 20 MG tablet Take 20 mg by mouth daily.   Yes Historical Provider, MD  magnesium hydroxide (MILK OF MAGNESIA) 400 MG/5ML suspension Take 30 mLs by mouth daily as needed for mild constipation.   Yes Historical Provider, MD  metoprolol (LOPRESSOR) 50 MG tablet Take 50 mg by mouth 2 (two) times daily.   Yes Historical Provider, MD  risperiDONE (RISPERDAL) 0.5 MG tablet Take 0.5 mg by mouth 2 (two) times daily.   Yes Historical Provider, MD  valproic acid (DEPAKENE) 250 MG capsule Take 250 mg by mouth 3 (three) times daily.    Yes Historical Provider, MD   BP 130/65  Pulse 57  Temp(Src) 97.8 F (36.6 C) (Oral)  Resp 16  SpO2 97% Physical Exam  Nursing note and vitals reviewed. Constitutional: He appears well-developed and well-nourished. No distress.  Laying in bed, in no acute distress  HENT:  Head: Normocephalic and atraumatic.  Right Ear: External ear normal.  Left Ear: External ear normal.  Nose: Nose normal.  No broken or loose teeth  Eyes: Conjunctivae are normal.  Neck: Normal range of motion. No spinous process tenderness and no muscular tenderness present. No tracheal deviation present.  No tenderness to palpation over cervical spine  Cardiovascular: Normal rate, regular rhythm, normal heart sounds, intact distal pulses and normal pulses.   Pulses:      Radial pulses are 2+ on the right side, and 2+ on the left side.       Dorsalis pedis pulses are 2+ on the right side, and 2+ on the left side.  Pulmonary/Chest: Effort normal and breath sounds normal. No stridor.  Abdominal: Soft. He exhibits no distension. There is no tenderness.  Musculoskeletal: Normal range of motion.  Moves all extremities without guarding or ataxia No back pain   Neurological: He is alert.  Grip strength 5 out of 5 bilaterally Oriented to person and place.   Skin: Skin is warm and dry. He is not diaphoretic.  Psychiatric: He has a normal mood and affect. His behavior is normal.    ED Course  Procedures (including critical care time) Labs Review Labs Reviewed - No data to display  Imaging Review Ct Head Wo Contrast  08/30/2013   CLINICAL DATA:  Fall, altered mental status, severe headache.  EXAM: CT HEAD WITHOUT CONTRAST  CT CERVICAL SPINE WITHOUT CONTRAST  TECHNIQUE: Multidetector CT imaging of the head and cervical spine was performed following the standard protocol without intravenous contrast. Multiplanar CT image reconstructions of the cervical spine were also generated.  COMPARISON:  CT of the head Jul 03, 2013 and CT of the cervical spine January 15, 2013  FINDINGS: CT HEAD FINDINGS  The ventricles and sulci are normal for age. No intraparenchymal hemorrhage, mass effect nor midline shift. Patchy supratentorial white matter hypodensities are within normal range for patient's age and though non-specific suggest sequelae of chronic small vessel ischemic disease. No acute large vascular territory infarcts. Small bilateral inferior basal ganglia presumed perivascular spaces.  No abnormal extra-axial fluid collections. Basal cisterns are patent. Minimal calcific atherosclerosis of the carotid siphons.  No skull fracture. The included ocular globes and orbital contents are non-suspicious. The mastoid aircells and included paranasal sinuses are well-aerated. Patient is edentulous.  CT CERVICAL SPINE FINDINGS  Cervical vertebral bodies and posterior elements are intact and aligned with straightened cervical lordosis is. Moderate to severe C4-5, C5-6 and C7-T1 degenerative disc disease, moderate at C6-7. C1-2 articulation maintained with moderate to severe arthropathy. No destructive bony lesions. Moderate calcific atherosclerosis of the right carotid  bifurcation, mild on the left. 18 mm partially calcified right thyroid nodule.  Degenerative disc disease and facet arthropathy, mild canal stenosis C5-6. Moderate left C2-3, left C3-4 neural foraminal narrowing.  IMPRESSION: CT head:  No acute intracranial process.  Involutional changes. Mild to moderate white matter changes suggest chronic small vessel ischemic disease.  CT cervical spine: Straightened cervical lordosis without acute fracture nor malalignment.  19 mm dominant right thyroid nodule for which thyroid sonography is recommended on a nonemergent basis.   Electronically Signed   By: Awilda Metroourtnay  Bloomer   On: 08/30/2013 23:15   Ct Cervical Spine Wo Contrast  08/30/2013   CLINICAL DATA:  Fall, altered mental status, severe headache.  EXAM: CT HEAD WITHOUT CONTRAST  CT CERVICAL SPINE WITHOUT CONTRAST  TECHNIQUE: Multidetector CT imaging of the head and cervical spine was performed following the standard protocol without intravenous contrast. Multiplanar CT image reconstructions of the cervical spine were also generated.  COMPARISON:  CT of the head Jul 03, 2013 and CT of the cervical spine January 15, 2013  FINDINGS: CT HEAD FINDINGS  The ventricles and sulci are normal for age. No  intraparenchymal hemorrhage, mass effect nor midline shift. Patchy supratentorial white matter hypodensities are within normal range for patient's age and though non-specific suggest sequelae of chronic small vessel ischemic disease. No acute large vascular territory infarcts. Small bilateral inferior basal ganglia presumed perivascular spaces.  No abnormal extra-axial fluid collections. Basal cisterns are patent. Minimal calcific atherosclerosis of the carotid siphons.  No skull fracture. The included ocular globes and orbital contents are non-suspicious. The mastoid aircells and included paranasal sinuses are well-aerated. Patient is edentulous.  CT CERVICAL SPINE FINDINGS  Cervical vertebral bodies and posterior elements are  intact and aligned with straightened cervical lordosis is. Moderate to severe C4-5, C5-6 and C7-T1 degenerative disc disease, moderate at C6-7. C1-2 articulation maintained with moderate to severe arthropathy. No destructive bony lesions. Moderate calcific atherosclerosis of the right carotid bifurcation, mild on the left. 18 mm partially calcified right thyroid nodule.  Degenerative disc disease and facet arthropathy, mild canal stenosis C5-6. Moderate left C2-3, left C3-4 neural foraminal narrowing.  IMPRESSION: CT head:  No acute intracranial process.  Involutional changes. Mild to moderate white matter changes suggest chronic small vessel ischemic disease.  CT cervical spine: Straightened cervical lordosis without acute fracture nor malalignment.  19 mm dominant right thyroid nodule for which thyroid sonography is recommended on a nonemergent basis.   Electronically Signed   By: Awilda Metro   On: 08/30/2013 23:15     EKG Interpretation None      MDM   Final diagnoses:  Fall, initial encounter    Patient is a 73 year old male who presents after a witnessed fall. No LOC. Neuro exam is nonfocal. No external signs of trauma. Patient is at his baseline. CT cervical spine and head are negative for acute pathology. Will discharge back to [facility]. Dr. Criss Alvine evaluated patient and agrees with plan. Patient / Family / Caregiver informed of clinical course, understand medical decision-making process, and agree with plan.   Mora Bellman, PA-C 08/31/13 [phone]

## 2013-08-30 NOTE — Discharge Instructions (Signed)

## 2013-08-30 NOTE — ED Notes (Signed)
Bed: WA10 Expected date: 08/30/13 Expected time: 7:14 PM Means of arrival: Ambulance Comments: Fall from wheelchair

## 2013-08-30 NOTE — ED Notes (Signed)
[facility] by [facility] with [facility]/o fall.  Per EMS, [facility] reported that pt attempted to get out of his wheelchair, lost his balance and fell, hitting his head on the wall.  Has had no loss of consciousness, no c/o pain.  Pt was sent here for further evaluation.

## 2013-08-30 NOTE — ED Notes (Signed)
Patient transported to CT 

## 2013-08-31 NOTE — ED Notes (Signed)
[facility] called for transport.  

## 2013-09-02 NOTE — ED Provider Notes (Signed)
Medical screening examination/treatment/procedure(s) were conducted as a shared visit with non-physician practitioner(s) and myself.  I personally evaluated the patient during the encounter.   EKG Interpretation None       [facility] No signs of trauma. CT's negative. D/C.  Audree CamelScott T Caitlyn Buchanan, MD 09/02/13 2203

## 2013-10-21 ENCOUNTER — Emergency Department (HOSPITAL_COMMUNITY): Payer: Medicare Other

## 2013-10-21 ENCOUNTER — Encounter (HOSPITAL_COMMUNITY): Payer: Self-pay | Admitting: Emergency Medicine

## 2013-10-21 ENCOUNTER — Emergency Department (HOSPITAL_COMMUNITY)
Admission: EM | Admit: 2013-10-21 | Discharge: 2013-10-21 | Disposition: A | Discharge status: Home / Self Care | Payer: Medicare Other | Attending: Emergency Medicine | Admitting: Emergency Medicine

## 2013-10-21 DIAGNOSIS — F0280 Dementia in other diseases classified elsewhere without behavioral disturbance: Secondary | ICD-10-CM | POA: Diagnosis not present

## 2013-10-21 DIAGNOSIS — W050XXA Fall from non-moving wheelchair, initial encounter: Secondary | ICD-10-CM | POA: Diagnosis not present

## 2013-10-21 DIAGNOSIS — W19XXXA Unspecified fall, initial encounter: Secondary | ICD-10-CM

## 2013-10-21 DIAGNOSIS — F3289 Other specified depressive episodes: Secondary | ICD-10-CM | POA: Diagnosis not present

## 2013-10-21 DIAGNOSIS — Y9389 Activity, other specified: Secondary | ICD-10-CM | POA: Insufficient documentation

## 2013-10-21 DIAGNOSIS — Y9289 Other specified places as the place of occurrence of the external cause: Secondary | ICD-10-CM | POA: Diagnosis not present

## 2013-10-21 DIAGNOSIS — G309 Alzheimer's disease, unspecified: Secondary | ICD-10-CM | POA: Diagnosis not present

## 2013-10-21 DIAGNOSIS — Z79899 Other long term (current) drug therapy: Secondary | ICD-10-CM | POA: Insufficient documentation

## 2013-10-21 DIAGNOSIS — W1809XA Striking against other object with subsequent fall, initial encounter: Secondary | ICD-10-CM | POA: Insufficient documentation

## 2013-10-21 DIAGNOSIS — F329 Major depressive disorder, single episode, unspecified: Secondary | ICD-10-CM | POA: Diagnosis not present

## 2013-10-21 DIAGNOSIS — H9190 Unspecified hearing loss, unspecified ear: Secondary | ICD-10-CM | POA: Diagnosis not present

## 2013-10-21 DIAGNOSIS — Z043 Encounter for examination and observation following other accident: Secondary | ICD-10-CM | POA: Insufficient documentation

## 2013-10-21 DIAGNOSIS — Z87891 Personal history of nicotine dependence: Secondary | ICD-10-CM | POA: Insufficient documentation

## 2013-10-21 DIAGNOSIS — I10 Essential (primary) hypertension: Secondary | ICD-10-CM | POA: Insufficient documentation

## 2013-10-21 NOTE — ED Notes (Signed)
Ptar called 

## 2013-10-21 NOTE — ED Notes (Signed)
Patient transported to CT 

## 2013-10-21 NOTE — ED Notes (Signed)
Bed: WA21 Expected date:  Expected time:  Means of arrival:  Comments: EMS fall  

## 2013-10-21 NOTE — ED Notes (Signed)
Per EMS, nursing home states the pt had a witnessed fall out of his wheelchair. Nursing home states the pt hit his head. No visible signs of trauma to pt. Nursing home states he is at baseline mental status. Pt denies pain.   EMS states the patient's wife did not want pt to be seen at ED, due to concern of medicare not paying for his care. Wife is POA, her number is [phone].

## 2013-10-21 NOTE — ED Provider Notes (Signed)
CSN: [id number]     Arrival date & time 10/21/13  1540 History   First MD Initiated Contact with Patient 10/21/13 1607     Chief Complaint  Patient presents with  . Fall     (Consider location/radiation/quality/duration/timing/severity/associated sxs/prior Treatment) Patient is a 73 y.o. male presenting with fall. The history is provided by the patient.  Fall This is a recurrent problem. The current episode started 3 to 5 hours ago. Episode frequency: once. The problem has not changed since onset.Pertinent negatives include no chest pain and no abdominal pain. Nothing aggravates the symptoms. Nothing relieves the symptoms.    Past Medical History  Diagnosis Date  . Dementia   . Depression   . Hypertension   . Foot drop   . Hearing loss   . Alzheimer disease    Past Surgical History  Procedure Laterality Date  . No past surgeries     No family history on file. History  Substance Use Topics  . Smoking status: Former Games developer  . 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 Alcohol Use: No    Review of Systems  Unable to perform ROS: Dementia  Cardiovascular: Negative for chest pain.  Gastrointestinal: Negative for abdominal pain.      Allergies  Review of patient's allergies indicates no known allergies.  Home Medications   Prior to Admission medications   Medication Sig Start Date End Date Taking? Authorizing Provider  acetaminophen (TYLENOL) 500 MG tablet Take 500 mg by mouth every 4 (four) hours as needed for mild pain, fever or headache.     Historical Provider, MD  alum & mag hydroxide-simeth (MAALOX/MYLANTA) 200-200-20 MG/5ML suspension Take 30 mLs by mouth every 6 (six) hours as needed for indigestion or heartburn.    Historical Provider, MD  guaiFENesin (ROBITUSSIN) 100 MG/5ML liquid Take 200 mg by mouth 3 (three) times daily as needed for cough.    Historical Provider, MD  hydrochlorothiazide (HYDRODIURIL) 25 MG tablet Take 25  mg by mouth daily.    Historical Provider, MD  lisinopril (PRINIVIL,ZESTRIL) 20 MG tablet Take 20 mg by mouth daily.    Historical Provider, MD  magnesium hydroxide (MILK OF MAGNESIA) 400 MG/5ML suspension Take 30 mLs by mouth daily as needed for mild constipation.    Historical Provider, MD  metoprolol (LOPRESSOR) 50 MG tablet Take 50 mg by mouth 2 (two) times daily.    Historical Provider, MD  risperiDONE (RISPERDAL) 0.5 MG tablet Take 0.5 mg by mouth 2 (two) times daily.    Historical Provider, MD  valproic acid (DEPAKENE) 250 MG capsule Take 250 mg by mouth 3 (three) times daily.     Historical Provider, MD   BP 126/70  Pulse 80  Temp(Src) 98.4 F (36.9 C) (Oral)  Resp 18  SpO2 99% Physical Exam  Nursing note and vitals reviewed. Constitutional: He appears well-developed and well-nourished. No distress.  HENT:  Head: Normocephalic and atraumatic.  Mouth/Throat: Oropharynx is clear and moist. No oropharyngeal exudate.  Eyes: EOM are normal. Pupils are equal, round, and reactive to light.  Neck: Normal range of motion. Neck supple.  Cardiovascular: Normal rate and regular rhythm.  Exam reveals no friction rub.   No murmur heard. Pulmonary/Chest: Effort normal and breath sounds normal. No respiratory distress. He has no wheezes. He has no rales.  Abdominal: Soft. He exhibits no distension. There is no tenderness. There is no rebound.  Musculoskeletal: Normal range of motion. He  exhibits no edema.       Cervical back: He exhibits no bony tenderness and no deformity.       Thoracic back: He exhibits no bony tenderness and no deformity.       Lumbar back: He exhibits no bony tenderness and no deformity.  Neurological: He is alert. No cranial nerve deficit. He exhibits normal muscle tone. Coordination normal.  Skin: He is not diaphoretic.    ED Course  Procedures (including critical care time) Labs Review Labs Reviewed - No data to display  Imaging Review No results found.   EKG  Interpretation None      MDM   Final diagnoses:  Fall, initial encounter    42M with fall from his wheelchair. He hit his head. No trauma noted. AFVSS here. Hx of dementia, cannot provide reliable history. Not on anticoagulants. Here normal extremity exam. Chest and belly benign. No spinal tenderness. Will scan his head. CT negative, stable for discharge.  Elwin Mocha, MD 10/22/13 [phone]

## 2013-10-21 NOTE — Discharge Instructions (Signed)

## 2013-11-07 ENCOUNTER — Encounter (HOSPITAL_COMMUNITY): Payer: Self-pay | Admitting: Emergency Medicine

## 2013-11-07 ENCOUNTER — Emergency Department (HOSPITAL_COMMUNITY): Payer: Medicare Other

## 2013-11-07 ENCOUNTER — Emergency Department (HOSPITAL_COMMUNITY)
Admission: EM | Admit: 2013-11-07 | Discharge: 2013-11-07 | Disposition: A | Discharge status: Home / Self Care | Payer: Medicare Other | Attending: Emergency Medicine | Admitting: Emergency Medicine

## 2013-11-07 DIAGNOSIS — Y929 Unspecified place or not applicable: Secondary | ICD-10-CM | POA: Insufficient documentation

## 2013-11-07 DIAGNOSIS — H9190 Unspecified hearing loss, unspecified ear: Secondary | ICD-10-CM | POA: Insufficient documentation

## 2013-11-07 DIAGNOSIS — I10 Essential (primary) hypertension: Secondary | ICD-10-CM | POA: Diagnosis not present

## 2013-11-07 DIAGNOSIS — Z792 Long term (current) use of antibiotics: Secondary | ICD-10-CM | POA: Insufficient documentation

## 2013-11-07 DIAGNOSIS — Z79899 Other long term (current) drug therapy: Secondary | ICD-10-CM | POA: Insufficient documentation

## 2013-11-07 DIAGNOSIS — Y939 Activity, unspecified: Secondary | ICD-10-CM | POA: Diagnosis not present

## 2013-11-07 DIAGNOSIS — Z87891 Personal history of nicotine dependence: Secondary | ICD-10-CM | POA: Diagnosis not present

## 2013-11-07 DIAGNOSIS — G309 Alzheimer's disease, unspecified: Secondary | ICD-10-CM | POA: Insufficient documentation

## 2013-11-07 DIAGNOSIS — S0990XA Unspecified injury of head, initial encounter: Secondary | ICD-10-CM | POA: Insufficient documentation

## 2013-11-07 DIAGNOSIS — R296 Repeated falls: Secondary | ICD-10-CM | POA: Diagnosis not present

## 2013-11-07 DIAGNOSIS — W19XXXA Unspecified fall, initial encounter: Secondary | ICD-10-CM

## 2013-11-07 DIAGNOSIS — F0280 Dementia in other diseases classified elsewhere without behavioral disturbance: Secondary | ICD-10-CM | POA: Diagnosis not present

## 2013-11-07 LAB — CBC WITH DIFFERENTIAL/PLATELET
BASOS PCT: 0 % (ref 0–1)
Basophils Absolute: 0 10*3/uL (ref 0.0–0.1)
EOS PCT: 3 % (ref 0–5)
Eosinophils Absolute: 0.2 10*3/uL (ref 0.0–0.7)
HCT: 34.4 % — ABNORMAL LOW (ref 39.0–52.0)
HEMOGLOBIN: 11.9 g/dL — AB (ref 13.0–17.0)
LYMPHS ABS: 1.6 10*3/uL (ref 0.7–4.0)
Lymphocytes Relative: 31 % (ref 12–46)
MCH: 30.2 pg (ref 26.0–34.0)
MCHC: 34.6 g/dL (ref 30.0–36.0)
MCV: 87.3 fL (ref 78.0–100.0)
MONOS PCT: 11 % (ref 3–12)
Monocytes Absolute: 0.6 10*3/uL (ref 0.1–1.0)
Neutro Abs: 3 10*3/uL (ref 1.7–7.7)
Neutrophils Relative %: 55 % (ref 43–77)
Platelets: 232 10*3/uL (ref 150–400)
RBC: 3.94 MIL/uL — ABNORMAL LOW (ref 4.22–5.81)
RDW: 13.2 % (ref 11.5–15.5)
WBC: 5.3 10*3/uL (ref 4.0–10.5)

## 2013-11-07 LAB — URINALYSIS, ROUTINE W REFLEX MICROSCOPIC
BILIRUBIN URINE: NEGATIVE
GLUCOSE, UA: NEGATIVE mg/dL
Hgb urine dipstick: NEGATIVE
Ketones, ur: NEGATIVE mg/dL
Leukocytes, UA: NEGATIVE
Nitrite: NEGATIVE
PROTEIN: NEGATIVE mg/dL
Specific Gravity, Urine: 1.016 (ref 1.005–1.030)
UROBILINOGEN UA: 1 mg/dL (ref 0.0–1.0)
pH: 7 (ref 5.0–8.0)

## 2013-11-07 LAB — BASIC METABOLIC PANEL
Anion gap: 13 (ref 5–15)
BUN: 12 mg/dL (ref 6–23)
CO2: 25 mEq/L (ref 19–32)
CREATININE: 0.77 mg/dL (ref 0.50–1.35)
Calcium: 9.2 mg/dL (ref 8.4–10.5)
Chloride: 101 mEq/L (ref 96–112)
GFR, EST NON AFRICAN AMERICAN: 88 mL/min — AB (ref >90)
GLUCOSE: 83 mg/dL (ref 70–99)
Potassium: 4 mEq/L (ref 3.7–5.3)
Sodium: 139 mEq/L (ref 137–147)

## 2013-11-07 NOTE — ED Notes (Signed)
Initial Contact - pt awake, alert, resting on stretcher, pt denies needs/complaints, unsure why he is here.  Pt difficult to assess 2/2 mental state, reportedly at baseline per report.  Skin PWD.  No pain on palpation, no obvious injuries.  Speaking full sentences.  NAD.

## 2013-11-07 NOTE — ED Notes (Signed)
Bed: [facility] Expected date:  Expected time:  Means of arrival:  Comments: EMS- elderly, unwitnessed fall

## 2013-11-07 NOTE — ED Notes (Signed)
[facility] called for transport.  

## 2013-11-07 NOTE — ED Notes (Signed)
Attempted to call report to facility several times. Nobody answered the phone, report given to [facility].

## 2013-11-07 NOTE — ED Notes (Signed)
Attempted to give report to facility, nobody answered, report given to [facility].

## 2013-11-07 NOTE — ED Notes (Signed)
Pt assisted to reposition for comfort, given warm blanket and resting with eyes closed currently.

## 2013-11-07 NOTE — Discharge Instructions (Signed)
Fall Prevention in Hospitals °As a hospital patient, your condition and the treatments you receive can increase your risk for falls. Some additional risk factors for falls in a hospital include: °· Being in an unfamiliar environment. °· Being on bed rest. °· Your surgery. °· Taking certain medicines. °· Your tubing requirements, such as intravenous (IV) therapy or catheters. °It is important that you learn how to decrease fall risks while at the hospital. Below are important tips that can help prevent falls. °SAFETY TIPS FOR PREVENTING FALLS °Talk about your risk of falling. °· Ask your caregiver why you are at risk for falling. Is it your medicine, illness, tubing placement, or something else? °· Make a plan with your caregiver to keep you safe from falls. °· Ask your caregiver or pharmacist about side effect of your medicines. Some medicines can make you dizzy or affect your coordination. °Ask for help. °· Ask for help before getting out of bed. You may need to press your call button. °· Ask for assistance in getting you safely to the toilet. °· Ask for a walker or cane to be put at your bedside. Ask that most of the side rails on your bed be placed up before your caregiver leaves the room. °· Ask family or friends to sit with you. °· Ask for things that are out of your reach, such as your glasses, hearing aids, telephone, bedside table, or call button. °Follow these tips to avoid falling: °· Stay lying or seated, rather than standing, while waiting for help. °· Wear rubber-soled slippers or shoes whenever you walk in the hospital. °· Avoid quick, sudden movements. °¨ Change positions slowly. °¨ Sit on the side of your bed before standing. °¨ Stand up slowly and wait before you start to walk. °· Let your caregiver know if there is a spill on the floor. °· Pay careful attention to the medical equipment, electrical cords, and tubes around you. °· When you need help, use your call button by your bed or in the  bathroom. Wait for one of your caregivers to help you. °· If you feel dizzy or unsure of your footing, return to bed and wait for assistance. °· Avoid being distracted by the TV, telephone, or another person in your room. °· Do not lean or support yourself on rolling objects, such as IV poles or bedside tables. °Document Released: 01/25/2000 Document Revised: 01/14/2012 Document Reviewed: 10/05/2011 °[employer]® Patient Information ©2015 [employer]. This information is not intended to replace advice given to you by your health care provider. Make sure you discuss any questions you have with your health care provider. ° °

## 2013-11-07 NOTE — ED Notes (Signed)
PER EMS - pt from [facility] with c/o unwitnessed fall, was found by staff on R side.  Pt denies complaints.  Pt at baseline mentation, hx dementia/alzheimer.  Pt was in w/c for EMS, stood with assistance to transfer to EMS stretcher.  Awake, alert.

## 2013-11-07 NOTE — ED Provider Notes (Signed)
CSN: [id number]     Arrival date & time 11/07/13  1257 History   First MD Initiated Contact with Patient 11/07/13 1449     Chief Complaint  Patient presents with  . Fall     (Consider location/radiation/quality/duration/timing/severity/associated sxs/prior Treatment) Patient is a 73 y.o. male presenting with fall.  Fall This is a recurrent problem. The current episode started 1 to 2 hours ago. Episode frequency: once. The problem has not changed since onset.Pertinent negatives include no chest pain, no abdominal pain, no headaches and no shortness of breath. Nothing aggravates the symptoms. Nothing relieves the symptoms. He has tried nothing for the symptoms.    Past Medical History  Diagnosis Date  . Dementia   . Depression   . Hypertension   . Foot drop   . Hearing loss   . Alzheimer disease    Past Surgical History  Procedure Laterality Date  . No past surgeries     No family history on file. History  Substance Use Topics  . Smoking status: Former Games developer  . 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 Alcohol Use: No    Review of Systems  Unable to perform ROS: Dementia  Respiratory: Negative for shortness of breath.   Cardiovascular: Negative for chest pain.  Gastrointestinal: Negative for abdominal pain.  Neurological: Negative for headaches.      Allergies  Review of patient's allergies indicates no known allergies.  Home Medications   Prior to Admission medications   Medication Sig Start Date End Date Taking? Authorizing Provider  acetaminophen (TYLENOL) 500 MG tablet Take 500 mg by mouth 3 (three) times daily as needed for mild pain, fever or headache.     Historical Provider, MD  alum & mag hydroxide-simeth (MAALOX/MYLANTA) 200-200-20 MG/5ML suspension Take 30 mLs by mouth every 6 (six) hours as needed for indigestion or heartburn.    Historical Provider, MD  guaiFENesin (ROBITUSSIN) 100 MG/5ML liquid Take 200 mg by mouth  every 6 (six) hours as needed for cough.     Historical Provider, MD  hydrochlorothiazide (HYDRODIURIL) 25 MG tablet Take 25 mg by mouth daily.    Historical Provider, MD  lisinopril (PRINIVIL,ZESTRIL) 20 MG tablet Take 20 mg by mouth daily.    Historical Provider, MD  magnesium hydroxide (MILK OF MAGNESIA) 400 MG/5ML suspension Take 30 mLs by mouth at bedtime as needed for mild constipation.     Historical Provider, MD  metoprolol (LOPRESSOR) 50 MG tablet Take 50 mg by mouth 2 (two) times daily.    Historical Provider, MD  neomycin-bacitracin-polymyxin (NEOSPORIN) [phone] ointment Apply 1 application topically every 6 (six) hours as needed (with dressing changes for skin tears, abrasions or minor irritations).    Historical Provider, MD  PRESCRIPTION MEDICATION Apply 1 mL topically every 6 (six) hours as needed (anxiety/agitation). Lorazepam 0.5mg /g topical solution. "Apply 83ml(0.5mg ) q6h prn"    Historical Provider, MD  risperiDONE (RISPERDAL) 0.5 MG tablet Take 0.5 mg by mouth 2 (two) times daily.    Historical Provider, MD  rivastigmine (EXELON) 1.5 MG capsule Take 1.5 mg by mouth 2 (two) times daily.    Historical Provider, MD  scopolamine (TRANSDERM-SCOP) 1 MG/3DAYS Place 1 patch onto the skin every three (3) days as needed (increased secretions).    Historical Provider, MD  valproic acid (DEPAKENE) 250 MG capsule Take 250 mg by mouth 4 (four) times daily. For mood    Historical Provider, MD   BP 164/77  Pulse 76  Temp(Src) 98.7 F (37.1 C) (Oral)  Resp 18  SpO2 100% Physical Exam  Vitals reviewed. Constitutional: He appears well-developed and well-nourished.  HENT:  Head: Normocephalic and atraumatic.  Eyes: Conjunctivae and EOM are normal.  Neck: Normal range of motion. Neck supple.  Cardiovascular: Normal rate, regular rhythm and normal heart sounds.   Pulmonary/Chest: Effort normal and breath sounds normal. No respiratory distress.  Abdominal: He exhibits no distension. There  is no tenderness. There is no rebound and no guarding.  Musculoskeletal: Normal range of motion.  Neurological: He is alert. He has normal strength and normal reflexes. No cranial nerve deficit or sensory deficit. Gait (slow, unsteady) abnormal. GCS eye subscore is 4. GCS verbal subscore is 4. GCS motor subscore is 6.  Skin: Skin is warm and dry.    ED Course  Procedures (including critical care time) Labs Review Labs Reviewed  CBC WITH DIFFERENTIAL - Abnormal; Notable for the following:    RBC 3.94 (*)    Hemoglobin 11.9 (*)    HCT 34.4 (*)    All other components within normal limits  BASIC METABOLIC PANEL - Abnormal; Notable for the following:    GFR calc non Af Amer 88 (*)    All other components within normal limits  URINALYSIS, ROUTINE W REFLEX MICROSCOPIC    Imaging Review Dg Chest 2 View  11/07/2013   CLINICAL DATA:  Fall, confusion, hypertension, Alzheimer's dementia, former smoker  EXAM: CHEST  2 VIEW  COMPARISON:  06/25/2013  FINDINGS: Upper normal heart size.  Atherosclerotic calcification aorta.  Mediastinal contours and pulmonary vascularity normal.  Mild bibasilar atelectasis.  Lungs otherwise clear.  No pleural effusion or pneumothorax.  Bones unremarkable.  IMPRESSION: Mild bibasilar atelectasis.   Electronically Signed   By: Ulyses Southward M.D.   On: 11/07/2013 16:42   Ct Head Wo Contrast  11/07/2013   CLINICAL DATA:  Pain post trauma  EXAM: CT HEAD WITHOUT CONTRAST  TECHNIQUE: Contiguous axial images were obtained from the base of the skull through the vertex without intravenous contrast.  COMPARISON:  October 21, 2013  FINDINGS: There is moderate diffuse atrophy, stable. There is no appreciable mass, hemorrhage, extra-axial fluid collection, or midline shift. There is patchy small vessel disease in the centra semiovale bilaterally. 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 There is no new gray-white compartment lesion. There  is no demonstrable acute infarct. The bony calvarium appears intact. The mastoid air cells are clear. There is rather minimal mucosal thickening in the right maxillary antrum.  IMPRESSION: Atrophy with patchy periventricular small vessel disease. 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 No intracranial mass, hemorrhage, or acute appearing infarct. Rather minimal right maxillary sinus disease.   Electronically Signed   By: Bretta Bang M.D.   On: 11/07/2013 16:26     EKG Interpretation None      MDM   Final diagnoses:  Fall, initial encounter    73 y.o. male with pertinent PMH of prior falls, alzheimers presents after being found on the floor earlier today with suspected fall.  Patient resides at the nursing facility, has a history of numerous recurrent falls including one earlier this month with unremarkable workup. On arrival today vitals signs and physical exam as above without significant acute trauma evident. The CT scan and obtain basic lab work to ensure the patient doesn't have occult etiology for his fall.  Workup was unremarkable, likely chronic instability.  DC to facility in stable condition.  1. Fall, initial encounter         Mirian Mo, MD 11/07/13 1730

## 2013-12-06 ENCOUNTER — Emergency Department (HOSPITAL_COMMUNITY): Payer: Medicare Other

## 2013-12-06 ENCOUNTER — Encounter (HOSPITAL_COMMUNITY): Payer: Self-pay | Admitting: Emergency Medicine

## 2013-12-06 ENCOUNTER — Emergency Department (HOSPITAL_COMMUNITY)
Admission: EM | Admit: 2013-12-06 | Discharge: 2013-12-06 | Disposition: A | Discharge status: Home / Self Care | Payer: Medicare Other | Attending: Emergency Medicine | Admitting: Emergency Medicine

## 2013-12-06 DIAGNOSIS — F0390 Unspecified dementia without behavioral disturbance: Secondary | ICD-10-CM

## 2013-12-06 DIAGNOSIS — F0280 Dementia in other diseases classified elsewhere without behavioral disturbance: Secondary | ICD-10-CM | POA: Diagnosis not present

## 2013-12-06 DIAGNOSIS — Z87891 Personal history of nicotine dependence: Secondary | ICD-10-CM | POA: Diagnosis not present

## 2013-12-06 DIAGNOSIS — Y92128 Other place in nursing home as the place of occurrence of the external cause: Secondary | ICD-10-CM | POA: Diagnosis not present

## 2013-12-06 DIAGNOSIS — F329 Major depressive disorder, single episode, unspecified: Secondary | ICD-10-CM | POA: Insufficient documentation

## 2013-12-06 DIAGNOSIS — G309 Alzheimer's disease, unspecified: Secondary | ICD-10-CM | POA: Insufficient documentation

## 2013-12-06 DIAGNOSIS — H9190 Unspecified hearing loss, unspecified ear: Secondary | ICD-10-CM | POA: Diagnosis not present

## 2013-12-06 DIAGNOSIS — Z79899 Other long term (current) drug therapy: Secondary | ICD-10-CM | POA: Diagnosis not present

## 2013-12-06 DIAGNOSIS — I10 Essential (primary) hypertension: Secondary | ICD-10-CM | POA: Insufficient documentation

## 2013-12-06 DIAGNOSIS — Z8739 Personal history of other diseases of the musculoskeletal system and connective tissue: Secondary | ICD-10-CM | POA: Diagnosis not present

## 2013-12-06 DIAGNOSIS — W19XXXA Unspecified fall, initial encounter: Secondary | ICD-10-CM

## 2013-12-06 DIAGNOSIS — S0990XA Unspecified injury of head, initial encounter: Secondary | ICD-10-CM | POA: Diagnosis present

## 2013-12-06 DIAGNOSIS — W01198A Fall on same level from slipping, tripping and stumbling with subsequent striking against other object, initial encounter: Secondary | ICD-10-CM | POA: Insufficient documentation

## 2013-12-06 DIAGNOSIS — Y9301 Activity, walking, marching and hiking: Secondary | ICD-10-CM | POA: Diagnosis not present

## 2013-12-06 DIAGNOSIS — S0101XA Laceration without foreign body of scalp, initial encounter: Secondary | ICD-10-CM | POA: Diagnosis not present

## 2013-12-06 NOTE — ED Notes (Signed)
Bed: WA25 Expected date:  Expected time:  Means of arrival:  Comments: EMS fall 

## 2013-12-06 NOTE — ED Provider Notes (Signed)
CSN: [id number]     Arrival date & time 12/06/13  1821 History   First MD Initiated Contact with Patient 12/06/13 1832     Chief Complaint  Patient presents with  . Fall     (Consider location/radiation/quality/duration/timing/severity/associated sxs/prior Treatment) Patient is a 73 y.o. male presenting with fall. The history is provided by the patient, the nursing home, the EMS personnel and medical records. No language interpreter was used.  Fall    Arthur CrewsDavid Dunlap is a 73 y.o. male  with a hx of dementia, depression, HTN, foot drop, hearing loss,  presents to the Emergency Department via EMS fully immobilized after witnessed mechanical fall.  Nursing home staff  [name] reports that pt was walking down the hall when he lost his balance and fell hitting his head.  They deny LOC.  Pt unable to answer questions due to his dementia; level 5 caveat.    Past Medical History  Diagnosis Date  . Dementia   . Depression   . Hypertension   . Foot drop   . Hearing loss   . Alzheimer disease    Past Surgical History  Procedure Laterality Date  . No past surgeries     History reviewed. No pertinent family history. History  Substance Use Topics  . Smoking status: Former Games developermoker  . 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 Alcohol Use: No    Review of Systems  Unable to perform ROS: Dementia      Allergies  Review of patient's allergies indicates no known allergies.  Home Medications   Prior to Admission medications   Medication Sig Start Date End Date Taking? Authorizing Provider  hydrochlorothiazide (HYDRODIURIL) 25 MG tablet Take 25 mg by mouth daily.   Yes Historical Provider, MD  lisinopril (PRINIVIL,ZESTRIL) 20 MG tablet Take 20 mg by mouth daily.   Yes Historical Provider, MD  LORazepam (ATIVAN) 0.5 MG tablet Take 0.5 mg by mouth 2 (two) times daily.   Yes Historical Provider, MD  metoprolol (LOPRESSOR) 50 MG tablet Take 50  mg by mouth 2 (two) times daily.   Yes Historical Provider, MD  risperiDONE (RISPERDAL) 0.25 MG tablet Take 0.25 mg by mouth 2 (two) times daily.   Yes Historical Provider, MD  rivastigmine (EXELON) 1.5 MG capsule Take 1.5 mg by mouth 2 (two) times daily.   Yes Historical Provider, MD  valproic acid (DEPAKENE) 250 MG capsule Take 250 mg by mouth 4 (four) times daily. For mood   Yes Historical Provider, MD  acetaminophen (TYLENOL) 500 MG tablet Take 500 mg by mouth every 4 (four) hours as needed for mild pain, fever or headache. Not to exceed 2gm (or 3gm from all APAP sources)    Historical Provider, MD  alum & mag hydroxide-simeth (MAALOX/MYLANTA) 200-200-20 MG/5ML suspension Take 30 mLs by mouth every 6 (six) hours as needed for indigestion or heartburn.    Historical Provider, MD  guaiFENesin (ROBITUSSIN) 100 MG/5ML liquid Take 200 mg by mouth every 6 (six) hours as needed for cough.     Historical Provider, MD  magnesium hydroxide (MILK OF MAGNESIA) 400 MG/5ML suspension Take 30 mLs by mouth at bedtime as needed for mild constipation.     Historical Provider, MD  neomycin-bacitracin-polymyxin (NEOSPORIN) [phone] ointment Apply 1 application topically every 6 (six) hours as needed (with dressing changes for skin tears, abrasions or minor irritations).    Historical Provider, MD  scopolamine (TRANSDERM-SCOP) 1 MG/3DAYS Place  1 patch onto the skin every three (3) days as needed (increased secretions).    Historical Provider, MD   BP 133/60  Pulse 67  Temp(Src) 98.2 F (36.8 C) (Oral)  Resp 18  SpO2 100% Physical Exam  Nursing note and vitals reviewed. Constitutional: He is oriented to person, place, and time. He appears well-developed and well-nourished. No distress.  HENT:  Head: Normocephalic.  Right Ear: Tympanic membrane and ear canal normal.  Left Ear: Tympanic membrane and ear canal normal.  Nose: Nose normal.  Mouth/Throat: Uvula is midline and oropharynx is clear and moist. No  uvula swelling.  3.o cm laceration to the left scalp with associated hematoma  Eyes: Conjunctivae and EOM are normal. Pupils are equal, round, and reactive to light. No scleral icterus.  No horizontal, vertical or rotational nystagmus  Neck:  C-collar in place No deformity or step off; Pt does not endorse pain with palaption  Cardiovascular: Normal rate, regular rhythm, normal heart sounds and intact distal pulses.   No murmur heard. Pulmonary/Chest: Effort normal and breath sounds normal. No respiratory distress. He has no wheezes. He has no rales.  No contusion, flail segment Clear and equal breath sounds  Abdominal: Soft. Bowel sounds are normal. He exhibits no distension. There is no tenderness. There is no rebound and no guarding.  No contusions, abd soft and nontender  Musculoskeletal: Normal range of motion.  No deformity or step off of the T-spine or L-spine Pt does not endorse pain with palaption  Lymphadenopathy:    He has no cervical adenopathy.  Neurological: He is alert and oriented to person, place, and time. He has normal reflexes. No cranial nerve deficit. He exhibits normal muscle tone. Coordination normal.  Mental Status:  Alert to baseline Cranial Nerves:  II:  pupils equal, round, reactive to light III,IV, VI: ptosis not present, extra-ocular motions intact bilaterally  V,VII: smile symmetric, facial light touch sensation intact VIII: hearing grossly normal  XI: bilateral shoulder shrug equal  XII: midline tongue extension  Motor:  5/5 in upper and lower extremities bilaterally; moves all extremities freely without ataxia  Deep Tendon Reflexes: 2+ and symmetric  CV: distal pulses palpable throughout   Skin: Skin is warm and dry. No rash noted. He is not diaphoretic. No erythema.  Psychiatric: He has a normal mood and affect. His behavior is normal. Judgment and thought content normal.    ED Course  LACERATION REPAIR Date/Time: 12/06/2013 8:33 PM Performed  by: Dierdre Forth Authorized by: Dierdre Forth Site marked: the operative site was marked Imaging studies: imaging studies available Required items: required blood products, implants, devices, and special equipment available Patient identity confirmed: arm band Time out: Immediately prior to procedure a "time out" was called to verify the correct patient, procedure, equipment, support staff and site/side marked as required. Body area: head/neck Location details: scalp Laceration length: 3 cm Foreign bodies: no foreign bodies Tendon involvement: none Nerve involvement: none Vascular damage: no Patient sedated: no Preparation: Patient was prepped and draped in the usual sterile fashion. Irrigation solution: saline Irrigation method: syringe Amount of cleaning: standard Debridement: none Degree of undermining: none Skin closure: glue Approximation: close Approximation difficulty: simple Patient tolerance: Patient tolerated the procedure well with no immediate complications.   (including critical care time) Labs Review Labs Reviewed - No data to display  Imaging Review Ct Head Wo Contrast  12/06/2013   CLINICAL DATA:  Dementia with fall and left frontal laceration. Initial in caliber  EXAM: CT HEAD  WITHOUT CONTRAST  CT CERVICAL SPINE WITHOUT CONTRAST  TECHNIQUE: Multidetector CT imaging of the head and cervical spine was performed following the standard protocol without intravenous contrast. Multiplanar CT image reconstructions of the cervical spine were also generated.  COMPARISON:  11/07/2013 head CT 08/30/2013 cervical spine CT  FINDINGS: CT HEAD FINDINGS  Skull and Sinuses:Negative for fracture or destructive process. The mastoids, middle ears, and imaged paranasal sinuses are clear.  Orbits: No acute abnormality.  Brain: No evidence of acute abnormality, such as acute infarction, hemorrhage, hydrocephalus, or mass lesion/mass effect. There is generalized cerebral  volume loss which is age advanced. Prominent medial temporal lobe volume loss correlating with history of Alzheimer's disease. Mild for age small-vessel disease with ischemic gliosis best seen around the frontal horns lateral ventricles. 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  CT CERVICAL SPINE FINDINGS  Negative for acute fracture or subluxation. No prevertebral edema. No gross cervical canal hematoma. Diffuse spondylotic endplate changes and disc narrowing. No evidence of advanced foraminal or canal stenosis. 19 mm nodule in the right thyroid gland, considered incidental based on clinical circumstances. No significant osseous canal or foraminal stenosis.  IMPRESSION: 1. No evidence of acute intracranial or cervical spine injury. 2. Chronic findings are described above.   Electronically Signed   By: Tiburcio Pea M.D.   On: 12/06/2013 19:41   Ct Cervical Spine Wo Contrast  12/06/2013   CLINICAL DATA:  Dementia with fall and left frontal laceration. Initial in caliber  EXAM: CT HEAD WITHOUT CONTRAST  CT CERVICAL SPINE WITHOUT CONTRAST  TECHNIQUE: Multidetector CT imaging of the head and cervical spine was performed following the standard protocol without intravenous contrast. Multiplanar CT image reconstructions of the cervical spine were also generated.  COMPARISON:  11/07/2013 head CT 08/30/2013 cervical spine CT  FINDINGS: CT HEAD FINDINGS  Skull and Sinuses:Negative for fracture or destructive process. The mastoids, middle ears, and imaged paranasal sinuses are clear.  Orbits: No acute abnormality.  Brain: No evidence of acute abnormality, such as acute infarction, hemorrhage, hydrocephalus, or mass lesion/mass effect. There is generalized cerebral volume loss which is age advanced. Prominent medial temporal lobe volume loss correlating with history of Alzheimer's disease. Mild for age small-vessel disease with ischemic gliosis best seen around the frontal horns lateral  ventricles. 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  CT CERVICAL SPINE FINDINGS  Negative for acute fracture or subluxation. No prevertebral edema. No gross cervical canal hematoma. Diffuse spondylotic endplate changes and disc narrowing. No evidence of advanced foraminal or canal stenosis. 19 mm nodule in the right thyroid gland, considered incidental based on clinical circumstances. No significant osseous canal or foraminal stenosis.  IMPRESSION: 1. No evidence of acute intracranial or cervical spine injury. 2. Chronic findings are described above.   Electronically Signed   By: Tiburcio Pea M.D.   On: 12/06/2013 19:41     EKG Interpretation None      MDM   Final diagnoses:  Fall  Dementia  Scalp laceration, initial encounter   Arthur Dunlap presents with fall.  Pt with 8 mechanical falls in the last 2 months.  Hx of dementia and unable to provide info.  Pt without obvious neurologic deformity on exam. Patient is not taking any anticoagulants. Recently a laceration to the left scalp.   8:42 PM CT head and neck without acute abnormality or fracture. Laceration closed with Dermabond without difficulty. Patient continues to mentate at baseline, moving all extremities  without difficulty and changing position in the bed without pain or complaint.  I have personally reviewed patient's vitals, nursing note and any pertinent labs or imaging.  I performed an undressed physical exam.    It has been determined that no acute conditions requiring further emergency intervention are present at this time. The patient/guardian have been advised of the diagnosis and plan. I reviewed all labs and imaging including any potential incidental findings. We have discussed signs and symptoms that warrant return to the ED and they are listed in the discharge instructions.    Vital signs are stable at discharge.   BP 133/60  Pulse 67  Temp(Src) 98.2 F (36.8 C) (Oral)  Resp 18  SpO2  100%   The patient was discussed with and seen by Dr. Jeraldine LootsLockwood who agrees with the treatment plan.       Dahlia ClientHannah Jedidiah Demartini, PA-C 12/06/13 2329

## 2013-12-06 NOTE — ED Notes (Signed)
Per EMS[facility] c/o witnessed fall, ot was walking and lost balance and fell hitting his head. Per EMS small lac and hematoma to left temple. Pt on LSB with head blocks and c-collar.

## 2013-12-06 NOTE — ED Notes (Signed)
[facility]. PTAR made aware of same.

## 2013-12-06 NOTE — Discharge Instructions (Signed)
1. Medications: usual home medications 2. Treatment: rest, drink plenty of fluids, keep wound clean 3. Follow Up: Please followup with your primary doctor in 7 days for wound check, discussion of your diagnoses and further evaluation after today's visit; if you do not have a primary care doctor use the resource guide provided to find one; Please return to the ER for altered mental status or new falls.

## 2013-12-08 NOTE — ED Provider Notes (Signed)
  This was a shared visit with a mid-level provided (NP or PA).  Throughout the patient's course I was available for consultation/collaboration.  I saw the ECG (if appropriate), relevant labs and studies - I agree with the interpretation.  On my exam the patient was in no distress.  However, the patient's history of dementia limited his contribution to the history of present illness. Patient has a history of multiple falls, presents from a nursing facility, and today he is moving all of his extremities spontaneously, appears to be in no distress. Patient does have left head laceration, which required repair. After radiologic studies were reassuring, patient had suture repair, which I was available for supervision, assistance with throughout.  Patient was discharged in stable condition to his nursing facility.      Gerhard Munchobert Leny Morozov, MD 12/08/13 1416

## 2013-12-15 ENCOUNTER — Encounter (HOSPITAL_COMMUNITY): Payer: Self-pay | Admitting: *Deleted

## 2013-12-15 ENCOUNTER — Emergency Department (HOSPITAL_COMMUNITY)
Admission: EM | Admit: 2013-12-15 | Discharge: 2013-12-15 | Disposition: A | Discharge status: Skilled Nursing Facility | Payer: Medicare Other | Attending: Emergency Medicine | Admitting: Emergency Medicine

## 2013-12-15 DIAGNOSIS — Z8739 Personal history of other diseases of the musculoskeletal system and connective tissue: Secondary | ICD-10-CM | POA: Diagnosis not present

## 2013-12-15 DIAGNOSIS — Z9181 History of falling: Secondary | ICD-10-CM | POA: Insufficient documentation

## 2013-12-15 DIAGNOSIS — Z79899 Other long term (current) drug therapy: Secondary | ICD-10-CM | POA: Insufficient documentation

## 2013-12-15 DIAGNOSIS — Y9289 Other specified places as the place of occurrence of the external cause: Secondary | ICD-10-CM | POA: Insufficient documentation

## 2013-12-15 DIAGNOSIS — Z043 Encounter for examination and observation following other accident: Secondary | ICD-10-CM | POA: Diagnosis present

## 2013-12-15 DIAGNOSIS — G309 Alzheimer's disease, unspecified: Secondary | ICD-10-CM | POA: Diagnosis not present

## 2013-12-15 DIAGNOSIS — H9190 Unspecified hearing loss, unspecified ear: Secondary | ICD-10-CM | POA: Diagnosis not present

## 2013-12-15 DIAGNOSIS — F329 Major depressive disorder, single episode, unspecified: Secondary | ICD-10-CM | POA: Insufficient documentation

## 2013-12-15 DIAGNOSIS — Z87891 Personal history of nicotine dependence: Secondary | ICD-10-CM | POA: Diagnosis not present

## 2013-12-15 DIAGNOSIS — Y9389 Activity, other specified: Secondary | ICD-10-CM | POA: Diagnosis not present

## 2013-12-15 DIAGNOSIS — I10 Essential (primary) hypertension: Secondary | ICD-10-CM | POA: Insufficient documentation

## 2013-12-15 DIAGNOSIS — W010XXA Fall on same level from slipping, tripping and stumbling without subsequent striking against object, initial encounter: Secondary | ICD-10-CM | POA: Insufficient documentation

## 2013-12-15 DIAGNOSIS — F0280 Dementia in other diseases classified elsewhere without behavioral disturbance: Secondary | ICD-10-CM | POA: Insufficient documentation

## 2013-12-15 NOTE — ED Notes (Signed)
Bed: WA07 Expected date:  Expected time:  Means of arrival:  Comments: EMS elderly fall 

## 2013-12-15 NOTE — Discharge Instructions (Signed)
Strict fall precautions.  Follow up with primary care doctor in the next couple days.  Return to ER if worse, new symptoms, medical emergency, other concern.        Fall Prevention and Home Safety Falls cause injuries and can affect all age groups. It is possible to use preventive measures to significantly decrease the likelihood of falls. There are many simple measures which can make your home safer and prevent falls. OUTDOORS  Repair cracks and edges of walkways and driveways.  Remove high doorway thresholds.  Trim shrubbery on the main path into your home.  Have good outside lighting.  Clear walkways of tools, rocks, debris, and clutter.  Check that handrails are not broken and are securely fastened. Both sides of steps should have handrails.  Have leaves, snow, and ice cleared regularly.  Use sand or salt on walkways during winter months.  In the garage, clean up grease or oil spills. BATHROOM  Install night lights.  Install grab bars by the toilet and in the tub and shower.  Use non-skid mats or decals in the tub or shower.  Place a plastic non-slip stool in the shower to sit on, if needed.  Keep floors dry and clean up all water on the floor immediately.  Remove soap buildup in the tub or shower on a regular basis.  Secure bath mats with non-slip, double-sided rug tape.  Remove throw rugs and tripping hazards from the floors. BEDROOMS  Install night lights.  Make sure a bedside light is easy to reach.  Do not use oversized bedding.  Keep a telephone by your bedside.  Have a firm chair with side arms to use for getting dressed.  Remove throw rugs and tripping hazards from the floor. KITCHEN  Keep handles on pots and pans turned toward the center of the stove. Use back burners when possible.  Clean up spills quickly and allow time for drying.  Avoid walking on wet floors.  Avoid hot utensils and knives.  Position shelves so they are not too  high or low.  Place commonly used objects within easy reach.  If necessary, use a sturdy step stool with a grab bar when reaching.  Keep electrical cables out of the way.  Do not use floor polish or wax that makes floors slippery. If you must use wax, use non-skid floor wax.  Remove throw rugs and tripping hazards from the floor. STAIRWAYS  Never leave objects on stairs.  Place handrails on both sides of stairways and use them. Fix any loose handrails. Make sure handrails on both sides of the stairways are as long as the stairs.  Check carpeting to make sure it is firmly attached along stairs. Make repairs to worn or loose carpet promptly.  Avoid placing throw rugs at the top or bottom of stairways, or properly secure the rug with carpet tape to prevent slippage. Get rid of throw rugs, if possible.  Have an electrician put in a light switch at the top and bottom of the stairs. OTHER FALL PREVENTION TIPS  Wear low-heel or rubber-soled shoes that are supportive and fit well. Wear closed toe shoes.  When using a stepladder, make sure it is fully opened and both spreaders are firmly locked. Do not climb a closed stepladder.  Add color or contrast paint or tape to grab bars and handrails in your home. Place contrasting color strips on first and last steps.  Learn and use mobility aids as needed. Install an electrical emergency response  system.  Turn on lights to avoid dark areas. Replace light bulbs that burn out immediately. Get light switches that glow.  Arrange furniture to create clear pathways. Keep furniture in the same place.  Firmly attach carpet with non-skid or double-sided tape.  Eliminate uneven floor surfaces.  Select a carpet pattern that does not visually hide the edge of steps.  Be aware of all pets. OTHER HOME SAFETY TIPS  Set the water temperature for 120 F (48.8 C).  Keep emergency numbers on or near the telephone.  Keep smoke detectors on every level  of the home and near sleeping areas. Document Released: 01/17/2002 Document Revised: 07/29/2011 Document Reviewed: 04/18/2011 [facility] Patient Information 2015 [place], [place]. This information is not intended to replace advice given to you by your health care provider. Make sure you discuss any questions you have with your health care provider.    Fall Prevention in Hospitals As a hospital patient, your condition and the treatments you receive can increase your risk for falls. Some additional risk factors for falls in a hospital include:  Being in an unfamiliar environment.  Being on bed rest.  Your surgery.  Taking certain medicines.  Your tubing requirements, such as intravenous (IV) therapy or catheters. It is important that you learn how to decrease fall risks while at the hospital. Below are important tips that can help prevent falls. SAFETY TIPS FOR PREVENTING FALLS Talk about your risk of falling.  Ask your caregiver why you are at risk for falling. Is it your medicine, illness, tubing placement, or something else?  Make a plan with your caregiver to keep you safe from falls.  Ask your caregiver or pharmacist about side effect of your medicines. Some medicines can make you dizzy or affect your coordination. Ask for help.  Ask for help before getting out of bed. You may need to press your call button.  Ask for assistance in getting you safely to the toilet.  Ask for a walker or cane to be put at your bedside. Ask that most of the side rails on your bed be placed up before your caregiver leaves the room.  Ask family or friends to sit with you.  Ask for things that are out of your reach, such as your glasses, hearing aids, telephone, bedside table, or call button. Follow these tips to avoid falling:  Stay lying or seated, rather than standing, while waiting for help.  Wear rubber-soled slippers or shoes whenever you walk in the hospital.  Avoid quick, sudden  movements.  Change positions slowly.  Sit on the side of your bed before standing.  Stand up slowly and wait before you start to walk.  Let your caregiver know if there is a spill on the floor.  Pay careful attention to the medical equipment, electrical cords, and tubes around you.  When you need help, use your call button by your bed or in the bathroom. Wait for one of your caregivers to help you.  If you feel dizzy or unsure of your footing, return to bed and wait for assistance.  Avoid being distracted by the TV, telephone, or another person in your room.  Do not lean or support yourself on rolling objects, such as IV poles or bedside tables. Document Released: 01/25/2000 Document Revised: 01/14/2012 Document Reviewed: 10/05/2011 [facility] Patient Information 2015 [place], [place]. This information is not intended to replace advice given to you by your health care provider. Make sure you discuss any questions you have with your  health care provider. ° ° °

## 2013-12-15 NOTE — ED Notes (Signed)
Pt unable to bear weight upon standing, pts right foot would give in. I had to carry pt back to bed bearing all his weight.

## 2013-12-15 NOTE — ED Notes (Signed)
Unable to reach facility for care handoff.

## 2013-12-15 NOTE — ED Provider Notes (Signed)
CSN: [id number]     Arrival date & time 12/15/13  1710 History   First MD Initiated Contact with Patient 12/15/13 1713     Chief Complaint  Patient presents with  . Fall     (Consider location/radiation/quality/duration/timing/severity/associated sxs/prior Treatment) Patient is a 73 y.o. male presenting with fall. The history is provided by the patient.  Fall Pertinent negatives include no headaches.  pt with [facility] at [facility], was noted on ground, conscious and alert, mental status c/w baseline.  Pt denies pain or other c/o, is alert and content appearing.  Pt w hx dementia, very limited historian - level 5 caveat. No reported syncope or loc.  No emesis. pts mental status has remains c/w baseline since fall. Hx frequent falls.      Past Medical History  Diagnosis Date  . Dementia   . Depression   . Hypertension   . Foot drop   . Hearing loss   . Alzheimer disease    Past Surgical History  Procedure Laterality Date  . No past surgeries     History reviewed. No pertinent family history. History  Substance Use Topics  . Smoking status: Former Games developermoker  . 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 Alcohol Use: No       Review of Systems  Unable to perform ROS: Dementia  Constitutional: Negative for fever.  Neurological: Negative for headaches.  level 5 caveat      Allergies  Review of patient's allergies indicates no known allergies.  Home Medications   Prior to Admission medications   Medication Sig Start Date End Date Taking? Authorizing Provider  acetaminophen (TYLENOL) 500 MG tablet Take 500 mg by mouth every 4 (four) hours as needed for mild pain, fever or headache. Not to exceed 2gm (or 3gm from all APAP sources)    Historical Provider, MD  alum & mag hydroxide-simeth (MAALOX/MYLANTA) 200-200-20 MG/5ML suspension Take 30 mLs by mouth every 6 (six) hours as needed for indigestion or heartburn.    Historical Provider, MD   guaiFENesin (ROBITUSSIN) 100 MG/5ML liquid Take 200 mg by mouth every 6 (six) hours as needed for cough.     Historical Provider, MD  hydrochlorothiazide (HYDRODIURIL) 25 MG tablet Take 25 mg by mouth daily.    Historical Provider, MD  lisinopril (PRINIVIL,ZESTRIL) 20 MG tablet Take 20 mg by mouth daily.    Historical Provider, MD  LORazepam (ATIVAN) 0.5 MG tablet Take 0.5 mg by mouth 2 (two) times daily.    Historical Provider, MD  magnesium hydroxide (MILK OF MAGNESIA) 400 MG/5ML suspension Take 30 mLs by mouth at bedtime as needed for mild constipation.     Historical Provider, MD  metoprolol (LOPRESSOR) 50 MG tablet Take 50 mg by mouth 2 (two) times daily.    Historical Provider, MD  neomycin-bacitracin-polymyxin (NEOSPORIN) [phone] ointment Apply 1 application topically every 6 (six) hours as needed (with dressing changes for skin tears, abrasions or minor irritations).    Historical Provider, MD  risperiDONE (RISPERDAL) 0.25 MG tablet Take 0.25 mg by mouth 2 (two) times daily.    Historical Provider, MD  rivastigmine (EXELON) 1.5 MG capsule Take 1.5 mg by mouth 2 (two) times daily.    Historical Provider, MD  scopolamine (TRANSDERM-SCOP) 1 MG/3DAYS Place 1 patch onto the skin every three (3) days as needed (increased secretions).    Historical Provider, MD  valproic acid (DEPAKENE) 250 MG capsule Take 250 mg by mouth  4 (four) times daily. For mood    Historical Provider, MD   BP 132/85 mmHg  Pulse 81  Temp(Src) 98.7 F (37.1 C) (Oral)  SpO2 100% Physical Exam  Constitutional: He appears well-developed and well-nourished. No distress.  HENT:  Head: Atraumatic.  Mouth/Throat: Oropharynx is clear and moist.  No facial or scap sts or tenderness.   Eyes: Conjunctivae are normal. Pupils are equal, round, and reactive to light.  Neck: Normal range of motion. Neck supple. No tracheal deviation present.  Cardiovascular: Normal rate, regular rhythm, normal heart sounds and intact distal  pulses.   Pulmonary/Chest: Effort normal and breath sounds normal. No accessory muscle usage. No respiratory distress. He exhibits no tenderness.  Abdominal: Soft. He exhibits no distension. There is no tenderness.  Musculoskeletal: Normal range of motion. He exhibits no edema or tenderness.  CTLS spine, non tender, aligned, no step off. Good rom bil ext without pain or focal bony tenderness.  Neurological: He is alert.  Awake and alert, mental status described as c/w baseline. Moves bil ext purposefully w good strength.    Skin: Skin is warm and dry. He is not diaphoretic.  Psychiatric:  Alert, content.   Nursing note and vitals reviewed.   ED Course  Procedures (including critical care time) Labs Review    MDM   Reviewed nursing notes and prior charts for additional history.   From review old notes, pt in wheelchair, hx frequent falls when tried to get up and ambulate on own.  On recheck, no c/o pain. No focal pain or bony tenderness on exam. Good rom bil ext without pain. Spine nt.   Pt alert, mental status c/w baseline.  Pt appears stable for d/c.       Suzi RootsKevin E Tyion Boylen, MD 12/15/13 [phone]

## 2013-12-15 NOTE — ED Notes (Signed)
Per EMS[facility]  [facility] had unwitnessed [facility]. Pt was face down when EMS arrived. Pt has no obvious injury or deformity. Pt denies pain. A&O per baseline.

## 2013-12-15 NOTE — ED Notes (Signed)
Patient's daughter states that the patient is unable to ambulate independently. He is wheelchair bound and also he has parkinsons disease.

## 2013-12-15 NOTE — ED Notes (Signed)
Patient had an unwitnessed fall at the nursing facility and EMS was called for evaluation. Patient denies pain moves both upper and lower extremities with assistance. No obvious deformities.

## 2013-12-15 NOTE — ED Notes (Signed)
[facility] called for transport.  

## 2013-12-15 NOTE — ED Notes (Signed)
Three attempts made to reach someone at the facility no luck. The family has been contacted to get some information on the patient and to let them know the patient will not be kept.

## 2013-12-23 ENCOUNTER — Encounter (HOSPITAL_COMMUNITY): Payer: Self-pay | Admitting: Emergency Medicine

## 2013-12-23 ENCOUNTER — Emergency Department (HOSPITAL_COMMUNITY)
Admission: EM | Admit: 2013-12-23 | Discharge: 2013-12-23 | Disposition: A | Discharge status: Home / Self Care | Payer: Medicare Other | Attending: Emergency Medicine | Admitting: Emergency Medicine

## 2013-12-23 ENCOUNTER — Emergency Department (HOSPITAL_COMMUNITY): Payer: Medicare Other

## 2013-12-23 DIAGNOSIS — Z79899 Other long term (current) drug therapy: Secondary | ICD-10-CM | POA: Insufficient documentation

## 2013-12-23 DIAGNOSIS — F329 Major depressive disorder, single episode, unspecified: Secondary | ICD-10-CM | POA: Insufficient documentation

## 2013-12-23 DIAGNOSIS — I10 Essential (primary) hypertension: Secondary | ICD-10-CM | POA: Diagnosis not present

## 2013-12-23 DIAGNOSIS — W1839XA Other fall on same level, initial encounter: Secondary | ICD-10-CM | POA: Diagnosis not present

## 2013-12-23 DIAGNOSIS — S0990XA Unspecified injury of head, initial encounter: Secondary | ICD-10-CM | POA: Diagnosis present

## 2013-12-23 DIAGNOSIS — Z87891 Personal history of nicotine dependence: Secondary | ICD-10-CM | POA: Diagnosis not present

## 2013-12-23 DIAGNOSIS — G309 Alzheimer's disease, unspecified: Secondary | ICD-10-CM | POA: Insufficient documentation

## 2013-12-23 DIAGNOSIS — Y9389 Activity, other specified: Secondary | ICD-10-CM | POA: Insufficient documentation

## 2013-12-23 DIAGNOSIS — S0181XA Laceration without foreign body of other part of head, initial encounter: Secondary | ICD-10-CM | POA: Insufficient documentation

## 2013-12-23 DIAGNOSIS — Y998 Other external cause status: Secondary | ICD-10-CM | POA: Diagnosis not present

## 2013-12-23 DIAGNOSIS — Y92128 Other place in nursing home as the place of occurrence of the external cause: Secondary | ICD-10-CM | POA: Insufficient documentation

## 2013-12-23 DIAGNOSIS — F0280 Dementia in other diseases classified elsewhere without behavioral disturbance: Secondary | ICD-10-CM | POA: Insufficient documentation

## 2013-12-23 DIAGNOSIS — W19XXXA Unspecified fall, initial encounter: Secondary | ICD-10-CM

## 2013-12-23 NOTE — ED Notes (Signed)
[facility] pt fell-- turned around lost balance and fell, no LOC-- pt is nonverbal, has hx dementia/alzheimers

## 2013-12-23 NOTE — Discharge Instructions (Signed)
Fall precautions.   The glue will fall off on its own.   Follow up with your doctor.   Return to ER if you have another fall, vomiting, not behaving normally.

## 2013-12-23 NOTE — ED Provider Notes (Signed)
CSN: [id number]     Arrival date & time 12/23/13  [id number] History   First MD Initiated Contact with Patient 12/23/13 [phone]     Chief Complaint  Patient presents with  . Fall     (Consider location/radiation/quality/duration/timing/severity/associated sxs/prior Treatment) The history is provided by the EMS personnel. The history is limited by the condition of the patient.  Arthur CrewsDavid Dunlap is a 73 y.o. male hx of dementia, HTN, here with fall. He is demented and unable to give any history. [facility]. He was in the dining hall and was turning around and lost his balance and fall. It was a witnessed fall and no syncope as per staff. Upon further review of his chart, he has multiple falls. Not any anticoagulation. EMS noted abrasion on the left forehead.     Level V caveat- dementia   Past Medical History  Diagnosis Date  . Dementia   . Depression   . Hypertension   . Foot drop   . Hearing loss   . Alzheimer disease    Past Surgical History  Procedure Laterality Date  . No past surgeries     No family history on file. History  Substance Use Topics  . Smoking status: Former Games developermoker  . 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 Alcohol Use: No    Review of Systems  Unable to perform ROS: Dementia      Allergies  Review of patient's allergies indicates no known allergies.  Home Medications   Prior to Admission medications   Medication Sig Start Date End Date Taking? Authorizing Provider  hydrochlorothiazide (HYDRODIURIL) 25 MG tablet Take 25 mg by mouth daily.   Yes Historical Provider, MD  lisinopril (PRINIVIL,ZESTRIL) 20 MG tablet Take 20 mg by mouth daily.   Yes Historical Provider, MD  LORazepam (ATIVAN) 0.5 MG tablet Take 0.5 mg by mouth 2 (two) times daily.   Yes Historical Provider, MD  metoprolol (LOPRESSOR) 50 MG tablet Take 50 mg by mouth 2 (two) times daily.   Yes Historical Provider, MD   neomycin-bacitracin-polymyxin (NEOSPORIN) [phone] ointment Apply 1 application topically every 6 (six) hours as needed (with dressing changes for skin tears, abrasions or minor irritations).   Yes Historical Provider, MD  Nutritional Supplements (NUTRITIONAL SHAKE PO) Take 8 oz by mouth 3 (three) times daily with meals.   Yes Historical Provider, MD  PRESCRIPTION MEDICATION Apply 0.5 mg topically every 6 (six) hours as needed (anxiety agitation). Lorazepam TRANSDERM gel. Do not exceed 4 doses in 24 hours   Yes Historical Provider, MD  risperiDONE (RISPERDAL) 0.25 MG tablet Take 0.25 mg by mouth 2 (two) times daily.   Yes Historical Provider, MD  rivastigmine (EXELON) 1.5 MG capsule Take 1.5 mg by mouth 2 (two) times daily.   Yes Historical Provider, MD  valproic acid (DEPAKENE) 250 MG capsule Take 250-500 mg by mouth 3 (three) times daily. 2 cap in the morning, 1 cap at 1400, 2 cap at 1800   Yes Historical Provider, MD  acetaminophen (TYLENOL) 500 MG tablet Take 500 mg by mouth every 4 (four) hours as needed for mild pain, fever or headache. Not to exceed 2gm (or 3gm from all APAP sources)    Historical Provider, MD  alum & mag hydroxide-simeth (MAALOX/MYLANTA) 200-200-20 MG/5ML suspension Take 30 mLs by mouth every 6 (six) hours as needed for indigestion or heartburn.    Historical Provider,  MD  guaiFENesin (ROBITUSSIN) 100 MG/5ML liquid Take 200 mg by mouth every 6 (six) hours as needed for cough.     Historical Provider, MD  magnesium hydroxide (MILK OF MAGNESIA) 400 MG/5ML suspension Take 30 mLs by mouth at bedtime as needed for mild constipation.     Historical Provider, MD  scopolamine (TRANSDERM-SCOP) 1 MG/3DAYS Place 1 patch onto the skin every three (3) days as needed (increased secretions).    Historical Provider, MD   BP 122/67 mmHg  Pulse 68  Temp(Src) 98.5 F (36.9 C) (Oral)  Resp 14  SpO2 100% Physical Exam  Constitutional:  Chronically ill, nonverbal (baseline)   HENT:   Head: Normocephalic.  Mouth/Throat: Oropharynx is clear and moist.  1 in superficial laceration L forehead   Eyes: Conjunctivae are normal. Pupils are equal, round, and reactive to light.  Neck:  c collar in place   Cardiovascular: Normal rate, regular rhythm and normal heart sounds.   Pulmonary/Chest: Effort normal and breath sounds normal. No respiratory distress. He has no wheezes. He has no rales.  Abdominal: Soft. Bowel sounds are normal. He exhibits no distension. There is no tenderness. There is no rebound and no guarding.  Musculoskeletal:  No midline spinal tenderness or step of. Nl ROM bilateral hips. Pelvis stable. No obvious extremity trauma   Neurological: He is alert.  Demented, moving all extremities   Skin: Skin is warm and dry.  Psychiatric:  Unable   Nursing note and vitals reviewed.   ED Course  Procedures (including critical care time)   LACERATION REPAIR Performed by: Chaney MallingYAO, [place] Authorized by: Chaney MallingYAO, Tyde Consent: Verbal consent obtained. Risks and benefits: risks, benefits and alternatives were discussed Consent given by: patient Patient identity confirmed: provided demographic data Prepped and Draped in normal sterile fashion Wound explored  Laceration Location: L forehead  Laceration Length: 1.5 cm  No Foreign Bodies seen or palpated  Anesthesia: none    Irrigation method: syringe Amount of cleaning: standard  Skin closure: dermabond   Patient tolerance: Patient tolerated the procedure well with no immediate complications.   Labs Reviewed - No data to display  Imaging Review Ct Head Wo Contrast  12/23/2013   CLINICAL DATA:  Larey SeatFell.  Hit head.  EXAM: CT HEAD WITHOUT CONTRAST  CT CERVICAL SPINE WITHOUT CONTRAST  TECHNIQUE: Multidetector CT imaging of the head and cervical spine was performed following the standard protocol without intravenous contrast. Multiplanar CT image reconstructions of the cervical spine were also generated.   COMPARISON:  Numerous recent prior examinations for the same indication.  FINDINGS: CT HEAD FINDINGS  Stable age related cerebral atrophy, ventriculomegaly and periventricular white matter disease. No extra-axial fluid collections are identified. No CT findings for acute hemispheric infarction or intracranial hemorrhage. No mass lesions. The brainstem and cerebellum are normal.  No acute skull fracture.  CT CERVICAL SPINE FINDINGS  Stable degenerative cervical spondylosis.  No acute fracture.  IMPRESSION: Stable age related changes in the brain but no acute findings. No skull fracture.  Stable degenerative cervical spondylosis but no acute cervical spine fracture.   Electronically Signed   By: Loralie ChampagneMark  Gallerani M.D.   On: 12/23/2013 10:45   Ct Cervical Spine Wo Contrast  12/23/2013   CLINICAL DATA:  Larey SeatFell.  Hit head.  EXAM: CT HEAD WITHOUT CONTRAST  CT CERVICAL SPINE WITHOUT CONTRAST  TECHNIQUE: Multidetector CT imaging of the head and cervical spine was performed following the standard protocol without intravenous contrast. Multiplanar CT image reconstructions of the cervical spine  were also generated.  COMPARISON:  Numerous recent prior examinations for the same indication.  FINDINGS: CT HEAD FINDINGS  Stable age related cerebral atrophy, ventriculomegaly and periventricular white matter disease. No extra-axial fluid collections are identified. No CT findings for acute hemispheric infarction or intracranial hemorrhage. No mass lesions. The brainstem and cerebellum are normal.  No acute skull fracture.  CT CERVICAL SPINE FINDINGS  Stable degenerative cervical spondylosis.  No acute fracture.  IMPRESSION: Stable age related changes in the brain but no acute findings. No skull fracture.  Stable degenerative cervical spondylosis but no acute cervical spine fracture.   Electronically Signed   By: Loralie Champagne M.D.   On: 12/23/2013 10:45     EKG Interpretation None      MDM   Final diagnoses:  Fall     Arthur Dunlap is a 73 y.o. male here with fall with forehead abrasion. Will get CT head/neck. Has frequent falls and no syncope today.   11:55 AM CT showed no fracture. Laceration dermabonded. Will d/c back.   Richardean Canal, MD 12/23/13 1155

## 2013-12-29 ENCOUNTER — Emergency Department (HOSPITAL_COMMUNITY): Payer: Medicare Other

## 2013-12-29 ENCOUNTER — Emergency Department (HOSPITAL_COMMUNITY)
Admission: EM | Admit: 2013-12-29 | Discharge: 2013-12-29 | Disposition: A | Discharge status: Home / Self Care | Payer: Medicare Other | Attending: Emergency Medicine | Admitting: Emergency Medicine

## 2013-12-29 ENCOUNTER — Encounter (HOSPITAL_COMMUNITY): Payer: Self-pay | Admitting: Emergency Medicine

## 2013-12-29 DIAGNOSIS — H9190 Unspecified hearing loss, unspecified ear: Secondary | ICD-10-CM | POA: Insufficient documentation

## 2013-12-29 DIAGNOSIS — N390 Urinary tract infection, site not specified: Secondary | ICD-10-CM | POA: Diagnosis not present

## 2013-12-29 DIAGNOSIS — I10 Essential (primary) hypertension: Secondary | ICD-10-CM | POA: Insufficient documentation

## 2013-12-29 DIAGNOSIS — G309 Alzheimer's disease, unspecified: Secondary | ICD-10-CM | POA: Insufficient documentation

## 2013-12-29 DIAGNOSIS — Z87891 Personal history of nicotine dependence: Secondary | ICD-10-CM | POA: Insufficient documentation

## 2013-12-29 DIAGNOSIS — R4182 Altered mental status, unspecified: Secondary | ICD-10-CM | POA: Diagnosis present

## 2013-12-29 DIAGNOSIS — Z8739 Personal history of other diseases of the musculoskeletal system and connective tissue: Secondary | ICD-10-CM | POA: Diagnosis not present

## 2013-12-29 DIAGNOSIS — Z79899 Other long term (current) drug therapy: Secondary | ICD-10-CM | POA: Diagnosis not present

## 2013-12-29 LAB — URINALYSIS, ROUTINE W REFLEX MICROSCOPIC
Glucose, UA: NEGATIVE mg/dL
Hgb urine dipstick: NEGATIVE
Ketones, ur: 15 mg/dL — AB
NITRITE: NEGATIVE
PH: 7 (ref 5.0–8.0)
Protein, ur: 30 mg/dL — AB
SPECIFIC GRAVITY, URINE: 1.033 — AB (ref 1.005–1.030)
Urobilinogen, UA: 1 mg/dL (ref 0.0–1.0)

## 2013-12-29 LAB — CBC WITH DIFFERENTIAL/PLATELET
BASOS ABS: 0 10*3/uL (ref 0.0–0.1)
Basophils Relative: 0 % (ref 0–1)
Eosinophils Absolute: 0.1 10*3/uL (ref 0.0–0.7)
Eosinophils Relative: 2 % (ref 0–5)
HEMATOCRIT: 34.1 % — AB (ref 39.0–52.0)
Hemoglobin: 11.8 g/dL — ABNORMAL LOW (ref 13.0–17.0)
LYMPHS PCT: 20 % (ref 12–46)
Lymphs Abs: 1.2 10*3/uL (ref 0.7–4.0)
MCH: 30.1 pg (ref 26.0–34.0)
MCHC: 34.6 g/dL (ref 30.0–36.0)
MCV: 87 fL (ref 78.0–100.0)
Monocytes Absolute: 0.7 10*3/uL (ref 0.1–1.0)
Monocytes Relative: 12 % (ref 3–12)
NEUTROS ABS: 3.7 10*3/uL (ref 1.7–7.7)
Neutrophils Relative %: 66 % (ref 43–77)
PLATELETS: 205 10*3/uL (ref 150–400)
RBC: 3.92 MIL/uL — ABNORMAL LOW (ref 4.22–5.81)
RDW: 13.3 % (ref 11.5–15.5)
WBC: 5.7 10*3/uL (ref 4.0–10.5)

## 2013-12-29 LAB — I-STAT TROPONIN, ED: TROPONIN I, POC: 0 ng/mL (ref 0.00–0.08)

## 2013-12-29 LAB — URINE MICROSCOPIC-ADD ON

## 2013-12-29 LAB — BASIC METABOLIC PANEL
ANION GAP: 10 (ref 5–15)
BUN: 16 mg/dL (ref 6–23)
CHLORIDE: 105 meq/L (ref 96–112)
CO2: 27 meq/L (ref 19–32)
Calcium: 8.8 mg/dL (ref 8.4–10.5)
Creatinine, Ser: 0.77 mg/dL (ref 0.50–1.35)
GFR calc Af Amer: >90 mL/min (ref >90)
GFR calc non Af Amer: 88 mL/min — ABNORMAL LOW (ref >90)
Glucose, Bld: 87 mg/dL (ref 70–99)
Potassium: 3.7 mEq/L (ref 3.7–5.3)
SODIUM: 142 meq/L (ref 137–147)

## 2013-12-29 LAB — VALPROIC ACID LEVEL: Valproic Acid Lvl: 50.4 ug/mL (ref 50.0–100.0)

## 2013-12-29 MED ORDER — DEXTROSE 5 % IV SOLN
1.0000 g | Freq: Once | INTRAVENOUS | Status: AC
  Administered 2013-12-29: 1 g via INTRAVENOUS
  Filled 2013-12-29: qty 10

## 2013-12-29 MED ORDER — CEPHALEXIN 500 MG PO CAPS: 500.0000 mg | ORAL_CAPSULE | Freq: Two times a day (BID) | ORAL | Status: AC

## 2013-12-29 MED ORDER — SODIUM CHLORIDE 0.9 % IV BOLUS (SEPSIS)
500.0000 mL | Freq: Once | INTRAVENOUS | Status: AC
  Administered 2013-12-29: 500 mL via INTRAVENOUS

## 2013-12-29 NOTE — Discharge Instructions (Signed)

## 2013-12-29 NOTE — ED Provider Notes (Signed)
TIME SEEN: 5:30 PM  CHIEF COMPLAINT: Altered mental status  HPI: Pt is a 73 y.o. M with history of Alzheimer's dementia, hypertension who [facility] with altered metal status. Discussed with nursing home staff who reports that patient is normally talkative, ambulatory and able to feed himself. Over the past 2-3 days they describe him as "lethargic". They state that he is not eating and drinking normally, not getting out of bed as frequently and not as talkative. Discussed with patient's wife who confirms this. No known recent change in medications. No known recent infectious symptoms. He did have a fall on 12/18/13. He is here frequently for falls and had a negative head CT at that time.   [facility] phone [phone]  Aurea GraffJoan - wife - POA - [phone]   ROS: Level V caveat for dementia  PAST MEDICAL HISTORY/PAST SURGICAL HISTORY:  Past Medical History  Diagnosis Date  . Dementia   . Depression   . Hypertension   . Foot drop   . Hearing loss   . Alzheimer disease     MEDICATIONS:  Prior to Admission medications   Medication Sig Start Date End Date Taking? Authorizing Provider  acetaminophen (TYLENOL) 500 MG tablet Take 500 mg by mouth every 4 (four) hours as needed for mild pain, fever or headache. Not to exceed 2gm (or 3gm from all APAP sources)    Historical Provider, MD  alum & mag hydroxide-simeth (MAALOX/MYLANTA) 200-200-20 MG/5ML suspension Take 30 mLs by mouth every 6 (six) hours as needed for indigestion or heartburn.    Historical Provider, MD  guaiFENesin (ROBITUSSIN) 100 MG/5ML liquid Take 200 mg by mouth every 6 (six) hours as needed for cough.     Historical Provider, MD  hydrochlorothiazide (HYDRODIURIL) 25 MG tablet Take 25 mg by mouth daily.    Historical Provider, MD  lisinopril (PRINIVIL,ZESTRIL) 20 MG tablet Take 20 mg by mouth daily.    Historical Provider, MD  LORazepam  (ATIVAN) 0.5 MG tablet Take 0.5 mg by mouth 2 (two) times daily.    Historical Provider, MD  magnesium hydroxide (MILK OF MAGNESIA) 400 MG/5ML suspension Take 30 mLs by mouth at bedtime as needed for mild constipation.     Historical Provider, MD  metoprolol (LOPRESSOR) 50 MG tablet Take 50 mg by mouth 2 (two) times daily.    Historical Provider, MD  neomycin-bacitracin-polymyxin (NEOSPORIN) [phone] ointment Apply 1 application topically every 6 (six) hours as needed (with dressing changes for skin tears, abrasions or minor irritations).    Historical Provider, MD  Nutritional Supplements (NUTRITIONAL SHAKE PO) Take 8 oz by mouth 3 (three) times daily with meals.    Historical Provider, MD  PRESCRIPTION MEDICATION Apply 0.5 mg topically every 6 (six) hours as needed (anxiety agitation). Lorazepam TRANSDERM gel. Do not exceed 4 doses in 24 hours    Historical Provider, MD  risperiDONE (RISPERDAL) 0.25 MG tablet Take 0.25 mg by mouth 2 (two) times daily.    Historical Provider, MD  rivastigmine (EXELON) 1.5 MG capsule Take 1.5 mg by mouth 2 (two) times daily.    Historical Provider, MD  scopolamine (TRANSDERM-SCOP) 1 MG/3DAYS Place 1 patch onto the skin every three (3) days as needed (increased secretions).    Historical Provider, MD  valproic acid (DEPAKENE) 250 MG capsule Take 250-500 mg by mouth 3 (three) times daily. 2 cap in the morning, 1 cap at 1400, 2 cap at 1800  Historical Provider, MD    ALLERGIES:  No Known Allergies  SOCIAL HISTORY:  History  Substance Use Topics  . Smoking status: Former Games developermoker  . 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 Alcohol Use: No    FAMILY HISTORY: No family history on file.  EXAM: BP 131/71 mmHg  Pulse 70  Temp(Src) 99.4 F (37.4 C) (Oral)  Resp 14  SpO2 97% CONSTITUTIONAL: Alert and oriented to person but not place and time, in no apparent distress, elderly HEAD: Normocephalic EYES: Conjunctivae clear,  PERRL, EOMI; patient has some left-sided periorbital ecchymosis and swelling and a small abrasion above his right eyebrow ENT: normal nose; no rhinorrhea; moist mucous membranes; pharynx without lesions noted; no dental injury; no septal hematoma NECK: Supple, no meningismus, no LAD; no midline spinal tenderness, step-off or deformity CARD: RRR; S1 and S2 appreciated; no murmurs, no clicks, no rubs, no gallops RESP: Normal chest excursion without splinting or tachypnea; breath sounds clear and equal bilaterally; no wheezes, no rhonchi, no rales; chest wall stable, nontender to palpation ABD/GI: Normal bowel sounds; non-distended; soft, non-tender, no rebound, no guarding PELVIS:  stable, nontender to palpation BACK:  The back appears normal and is non-tender to palpation, there is no CVA tenderness; no midline spinal tenderness, step-off or deformity EXT: Normal ROM in all joints; non-tender to palpation; no edema; normal capillary refill; no cyanosis    SKIN: Normal color for age and race; warm NEURO: Moves all extremities equally; no obvious facial droop or slurred speech, patient will follow commands intermittently and answer some questions PSYCH: The patient's mood and manner are appropriate. Grooming and personal hygiene are appropriate.  MEDICAL DECISION MAKING: Pt here with altered mental status. They report he is not as alert, talkative and eating and drinking. Will obtain a CT of his head, labs, chest x-ray and urine to evaluate for possible organic causes for altered metal status. He has no focal neurologic deficit however.  Will give IV fluids and continue to closely monitor.  ED PROGRESS: Pt's labs are unremarkable. Chest x-ray shows no infiltrate or edema. EKG is nonischemic. Urine shows trace leukocytes and few bacteria. Urine culture pending. We'll give ceftriaxone for possible UTI.  CT head shows no acute abnormalities. Given he has no focal neurologic deficits that do not feel he  needs an MRI of his brain. We'll discharge with prescription for Keflex for very mild UTI. I feel he is safe to go back to the nursing facility.      EKG Interpretation  Date/Time:  Thursday December 29 2013 17:11:19 EST Ventricular Rate:  68 PR Interval:  201 QRS Duration: 106 QT Interval:  409 QTC Calculation: 435 R Axis:   -29 Text Interpretation:  Sinus rhythm Borderline left axis deviation Confirmed by WARD,  DO, KRISTEN [phone] on 12/29/2013 5:14:21 PM        Layla MawKristen N Ward, DO 12/29/13 [phone]

## 2013-12-29 NOTE — ED Notes (Signed)
Per EMS nursing facility reported patient had a fall 12/18/13, pt noted to have AMS around 1500 today. Pt noted to have bruising to L eye/face since fall. Pt is a DNR.

## 2013-12-30 LAB — URINE CULTURE
CULTURE: NO GROWTH
Colony Count: NO GROWTH

## 2014-01-16 ENCOUNTER — Emergency Department (HOSPITAL_COMMUNITY)
Admission: EM | Admit: 2014-01-16 | Discharge: 2014-01-16 | Disposition: A | Discharge status: Home / Self Care | Payer: Medicare Other | Attending: Emergency Medicine | Admitting: Emergency Medicine

## 2014-01-16 ENCOUNTER — Encounter (HOSPITAL_COMMUNITY): Payer: Self-pay | Admitting: Emergency Medicine

## 2014-01-16 DIAGNOSIS — S0121XA Laceration without foreign body of nose, initial encounter: Secondary | ICD-10-CM | POA: Insufficient documentation

## 2014-01-16 DIAGNOSIS — G309 Alzheimer's disease, unspecified: Secondary | ICD-10-CM | POA: Diagnosis not present

## 2014-01-16 DIAGNOSIS — Y9389 Activity, other specified: Secondary | ICD-10-CM | POA: Diagnosis not present

## 2014-01-16 DIAGNOSIS — W07XXXA Fall from chair, initial encounter: Secondary | ICD-10-CM | POA: Insufficient documentation

## 2014-01-16 DIAGNOSIS — I10 Essential (primary) hypertension: Secondary | ICD-10-CM | POA: Diagnosis not present

## 2014-01-16 DIAGNOSIS — H9190 Unspecified hearing loss, unspecified ear: Secondary | ICD-10-CM | POA: Diagnosis not present

## 2014-01-16 DIAGNOSIS — Y92129 Unspecified place in nursing home as the place of occurrence of the external cause: Secondary | ICD-10-CM | POA: Diagnosis not present

## 2014-01-16 DIAGNOSIS — F329 Major depressive disorder, single episode, unspecified: Secondary | ICD-10-CM | POA: Insufficient documentation

## 2014-01-16 DIAGNOSIS — F0280 Dementia in other diseases classified elsewhere without behavioral disturbance: Secondary | ICD-10-CM | POA: Insufficient documentation

## 2014-01-16 DIAGNOSIS — Z87891 Personal history of nicotine dependence: Secondary | ICD-10-CM | POA: Insufficient documentation

## 2014-01-16 DIAGNOSIS — Y998 Other external cause status: Secondary | ICD-10-CM | POA: Insufficient documentation

## 2014-01-16 DIAGNOSIS — W19XXXA Unspecified fall, initial encounter: Secondary | ICD-10-CM

## 2014-01-16 DIAGNOSIS — S022XXA Fracture of nasal bones, initial encounter for closed fracture: Secondary | ICD-10-CM | POA: Insufficient documentation

## 2014-01-16 DIAGNOSIS — Z79899 Other long term (current) drug therapy: Secondary | ICD-10-CM | POA: Diagnosis not present

## 2014-01-16 DIAGNOSIS — S0992XA Unspecified injury of nose, initial encounter: Secondary | ICD-10-CM | POA: Diagnosis present

## 2014-01-16 NOTE — ED Notes (Signed)
Small laceration at base of right nare dermabond per Gerrardampos. Campos at bedside for dermabond procedure.

## 2014-01-16 NOTE — Progress Notes (Signed)
CSW attempted to speak with the Patient. However, he was asleep. Per chart, the Pt was [facility] ED [facility], because he fell from a chair striking a table. Pt appears to have a nasal injury that has been bleeding.   According to the chart there was no LOC.  Trish MageBrittney Charrisse Masley, LCSWA [phone] ED CSW 01/16/2014 5:23 PM

## 2014-01-16 NOTE — ED Provider Notes (Signed)
CSN: [id number]     Arrival date & time 01/16/14  1456 History   First MD Initiated Contact with Patient 01/16/14 1527     Chief Complaint  Patient presents with  . Fall    Level V caveat: Dementia  HPI Patient is brought to the emergency department from his nursing facility with a witnessed fall from a chair striking a table.  This resulted in nasal injury with nasal swelling and a small amount of bleeding coming from his right manner.  He was brought to the ER for evaluation.  No reports a loss consciousness.  Patient was removing his arms and legs per EMS.  Noted to have full range of motion of arms and legs   Past Medical History  Diagnosis Date  . Dementia   . Depression   . Hypertension   . Foot drop   . Hearing loss   . Alzheimer disease    Past Surgical History  Procedure Laterality Date  . No past surgeries     No family history on file. History  Substance Use Topics  . Smoking status: Former Games developermoker  . 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 Alcohol Use: No    Review of Systems  All other systems reviewed and are negative.     Allergies  Review of patient's allergies indicates no known allergies.  Home Medications   Prior to Admission medications   Medication Sig Start Date End Date Taking? Authorizing Provider  hydrochlorothiazide (HYDRODIURIL) 25 MG tablet Take 25 mg by mouth daily.   Yes Historical Provider, MD  lisinopril (PRINIVIL,ZESTRIL) 20 MG tablet Take 20 mg by mouth daily.   Yes Historical Provider, MD  LORazepam (ATIVAN) 0.5 MG tablet Take 0.5 mg by mouth 2 (two) times daily.   Yes Historical Provider, MD  metoprolol (LOPRESSOR) 50 MG tablet Take 50 mg by mouth 2 (two) times daily.   Yes Historical Provider, MD  neomycin-bacitracin-polymyxin (NEOSPORIN) [phone] ointment Apply 1 application topically every 6 (six) hours as needed (with dressing changes for skin tears, abrasions or minor irritations).   Yes  Historical Provider, MD  Nutritional Supplements (NUTRITIONAL SHAKE PO) Take 8 oz by mouth 3 (three) times daily with meals.   Yes Historical Provider, MD  PRESCRIPTION MEDICATION Apply 0.5 mg topically every 6 (six) hours as needed (anxiety agitation). Lorazepam TRANSDERM gel. Do not exceed 4 doses in 24 hours   Yes Historical Provider, MD  risperiDONE (RISPERDAL) 0.25 MG tablet Take 0.25 mg by mouth 2 (two) times daily.   Yes Historical Provider, MD  rivastigmine (EXELON) 1.5 MG capsule Take 1.5 mg by mouth 2 (two) times daily.   Yes Historical Provider, MD  valproic acid (DEPAKENE) 250 MG capsule Take 250-500 mg by mouth 3 (three) times daily. 2 cap in the morning, 1 cap at 1400, 2 cap at 1800   Yes Historical Provider, MD  acetaminophen (TYLENOL) 500 MG tablet Take 500 mg by mouth every 4 (four) hours as needed for mild pain, fever or headache. Not to exceed 2gm (or 3gm from all APAP sources)    Historical Provider, MD  alum & mag hydroxide-simeth (MAALOX/MYLANTA) 200-200-20 MG/5ML suspension Take 30 mLs by mouth every 6 (six) hours as needed for indigestion or heartburn.    Historical Provider, MD  cephALEXin (KEFLEX) 500 MG capsule Take 1 capsule (500 mg total) by mouth 2 (two) times daily. Patient not taking: Reported on 01/16/2014 12/29/13  Kristen N Ward, DO  guaiFENesin (ROBITUSSIN) 100 MG/5ML liquid Take 200 mg by mouth every 6 (six) hours as needed for cough.     Historical Provider, MD  magnesium hydroxide (MILK OF MAGNESIA) 400 MG/5ML suspension Take 30 mLs by mouth at bedtime as needed for mild constipation.     Historical Provider, MD  scopolamine (TRANSDERM-SCOP) 1 MG/3DAYS Place 1 patch onto the skin every three (3) days as needed (increased secretions).    Historical Provider, MD   BP 165/97 mmHg  Pulse 81  Temp(Src) 98 F (36.7 C) (Oral)  Resp 18  SpO2 98% Physical Exam  Constitutional: He is oriented to person, place, and time. He appears well-developed and well-nourished.   HENT:  Head: Normocephalic and atraumatic.  Edentulous uppers and lowers.  No significant facial swelling except for nasal swelling without obvious deformity.  Small amount of dried blood in his bilateral nares.  Small punctate laceration just inferior to his right nares with active bleeding  Eyes: EOM are normal.  Neck: Normal range of motion.  C-spine nontender.  No cervical step-offs  Cardiovascular: Normal rate, regular rhythm, normal heart sounds and intact distal pulses.   Pulmonary/Chest: Effort normal and breath sounds normal. No respiratory distress.  Abdominal: Soft. He exhibits no distension. There is no tenderness.  Musculoskeletal: Normal range of motion.  Full range of motion bilateral hips knees and ankles.  Full range of motion bilateral wrists arms and shoulders.   Neurological: He is alert and oriented to person, place, and time.  Skin: Skin is warm and dry.  Psychiatric: He has a normal mood and affect. Judgment normal.  Nursing note and vitals reviewed.   ED Course  Procedures (including critical care time)  LACERATION REPAIR Performed by: Lyanne CoAMPOS,Lanasia Porras M Consent: Verbal consent obtained. Risks and benefits: risks, benefits and alternatives were discussed Patient identity confirmed: provided demographic data Time out performed prior to procedure Prepped and Draped in normal sterile fashion Wound explored Laceration Location: inferior to right nare Laceration Length: 0.25cm No Foreign Bodies seen or palpated Anesthesia: none Local anesthetic: none Anesthetic total: none Irrigation method: none Amount of cleaning: standard Skin closure: dermabond  Number of sutures or staples: dermabond Technique: dermabond Patient tolerance: Patient tolerated the procedure well with no immediate complications.   Labs Review Labs Reviewed - No data to display  Imaging Review No results found.   EKG Interpretation None      MDM   Final diagnoses:  Fall,  initial encounter  Nasal laceration, initial encounter  Nasal fracture, closed, initial encounter    Punctate laceration repaired.  No active bleeding after laceration repair with Dermabond.  Clinically the patient has a nasal fracture.  No anticoagulants.  No loss consciousness.  C-spine is nontender.  No indication for imaging.  Full range of motion bilateral hips knees and ankles.  Full range of motion bilateral wrists arms and shoulders.    Lyanne CoKevin M Lina Hitch, MD 01/16/14 [phone]

## 2014-01-16 NOTE — Progress Notes (Signed)
CSW met with Pt to assess needs. Pt was awake. However, he was not communicative.   Willette Brace [phone] ED CSW 01/16/2014 6:33 PM

## 2014-01-16 NOTE — Discharge Instructions (Signed)
Nasal Fracture A nasal fracture is a break or crack in the bones of the nose. A minor break usually heals in a month. You often will receive black eyes from a nasal fracture. This is not a cause for concern. The black eyes will go away over 1 to 2 weeks.  DIAGNOSIS  Your caregiver may want to examine you if you are concerned about a fracture of the nose. X-rays of the nose may not show a nasal fracture even when one is present. Sometimes your caregiver must wait 1 to 5 days after the injury to re-check the nose for alignment and to take additional X-rays. Sometimes the caregiver must wait until the swelling has gone down. TREATMENT Minor fractures that have caused no deformity often do not require treatment. More serious fractures where bones are displaced may require surgery. This will take place after the swelling is gone. Surgery will stabilize and align the fracture. HOME CARE INSTRUCTIONS   Put ice on the injured area.  Put ice in a plastic bag.  Place a towel between your skin and the bag.  Leave the ice on for 15-20 minutes, 03-04 times a day.  Take medications as directed by your caregiver.  Only take over-the-counter or prescription medicines for pain, discomfort, or fever as directed by your caregiver.  If your nose starts bleeding, squeeze the soft parts of the nose against the center wall while you are sitting in an upright position for 10 minutes.  Contact sports should be avoided for at least 3 to 4 weeks or as directed by your caregiver. SEEK MEDICAL CARE IF:  Your pain increases or becomes severe.  You continue to have nosebleeds.  The shape of your nose does not return to normal within 5 days.  You have pus draining from the nose. SEEK IMMEDIATE MEDICAL CARE IF:   You have bleeding from your nose that does not stop after 20 minutes of pinching the nostrils closed and keeping ice on the nose.  You have clear fluid draining from your nose.  You notice a grape-like  swelling on the dividing wall between the nostrils (septum). This is a collection of blood (hematoma) that must be drained to help prevent infection.  You have difficulty moving your eyes.  You have recurrent vomiting. Document Released: 01/25/2000 Document Revised: 04/21/2011 Document Reviewed: 05/13/2010 [facility] Patient Information 2015 [place] [place]. This information is not intended to replace advice given to you by your health care provider. Make sure you discuss any questions you have with your health care provider. Tissue Adhesive Wound Care Some cuts, wounds, lacerations, and incisions can be repaired by using tissue adhesive. Tissue adhesive is like glue. It holds the skin together, allowing for faster healing. It forms a strong bond on the skin in about 1 minute and reaches its full strength in about 2 or 3 minutes. The adhesive disappears naturally while the wound is healing. It is important to take proper care of your wound at home while it heals.  HOME CARE INSTRUCTIONS   Showers are allowed. Do not soak the area containing the tissue adhesive. Do not take baths, swim, or use hot tubs. Do not use any soaps or ointments on the wound. Certain ointments can weaken the glue.  If a bandage (dressing) has been applied, follow your health care provider's instructions for how often to change the dressing.   Keep the dressing dry if one has been applied.   Do not scratch, pick, or rub the adhesive.  Do not place tape over the adhesive. The adhesive could come off when pulling the tape off.   Protect the wound from further injury until it is healed.   Protect the wound from sun and tanning bed exposure while it is healing and for several weeks after healing.   Only take over-the-counter or prescription medicines as directed by your health care provider.   Keep all follow-up appointments as directed by your health care provider. SEEK IMMEDIATE MEDICAL CARE IF:   Your wound  becomes red, swollen, hot, or tender.   You develop a rash after the glue is applied.  You have increasing pain in the wound.   You have a red streak that goes away from the wound.   You have pus coming from the wound.   You have increased bleeding.  You have a fever.  You have shaking chills.   You notice a bad smell coming from the wound.   Your wound or adhesive breaks open.  MAKE SURE YOU:   Understand these instructions.  Will watch your condition.  Will get help right away if you are not doing well or get worse. Document Released: 07/23/2000 Document Revised: 11/17/2012 Document Reviewed: 08/18/2012 [facility] Patient Information 2015 [place], [place]. This information is not intended to replace advice given to you by your health care provider. Make sure you discuss any questions you have with your health care provider.

## 2014-01-16 NOTE — ED Notes (Signed)
[facility] Expected date:  Expected time:  Means of arrival:  Comments: EMS-fall-nose laceration

## 2014-01-16 NOTE — ED Notes (Signed)
Pt given apple juice per Frazier Buttachel Covil.

## 2014-01-16 NOTE — ED Notes (Addendum)
[facility] witnessed fall from chair resulting in nose injury. No LOC.

## 2014-02-20 ENCOUNTER — Emergency Department (HOSPITAL_COMMUNITY)

## 2014-02-20 ENCOUNTER — Emergency Department (HOSPITAL_COMMUNITY)
Admission: EM | Admit: 2014-02-20 | Discharge: 2014-02-20 | Disposition: A | Discharge status: Home / Self Care | Attending: Emergency Medicine | Admitting: Emergency Medicine

## 2014-02-20 ENCOUNTER — Encounter (HOSPITAL_COMMUNITY): Payer: Self-pay | Admitting: Emergency Medicine

## 2014-02-20 DIAGNOSIS — W19XXXA Unspecified fall, initial encounter: Secondary | ICD-10-CM

## 2014-02-20 DIAGNOSIS — S0990XA Unspecified injury of head, initial encounter: Secondary | ICD-10-CM

## 2014-02-20 DIAGNOSIS — Z792 Long term (current) use of antibiotics: Secondary | ICD-10-CM | POA: Diagnosis not present

## 2014-02-20 DIAGNOSIS — Z79899 Other long term (current) drug therapy: Secondary | ICD-10-CM | POA: Diagnosis not present

## 2014-02-20 DIAGNOSIS — Y9389 Activity, other specified: Secondary | ICD-10-CM | POA: Diagnosis not present

## 2014-02-20 DIAGNOSIS — G309 Alzheimer's disease, unspecified: Secondary | ICD-10-CM | POA: Diagnosis not present

## 2014-02-20 DIAGNOSIS — Y998 Other external cause status: Secondary | ICD-10-CM | POA: Diagnosis not present

## 2014-02-20 DIAGNOSIS — W050XXA Fall from non-moving wheelchair, initial encounter: Secondary | ICD-10-CM | POA: Insufficient documentation

## 2014-02-20 DIAGNOSIS — M47892 Other spondylosis, cervical region: Secondary | ICD-10-CM | POA: Insufficient documentation

## 2014-02-20 DIAGNOSIS — S0083XA Contusion of other part of head, initial encounter: Secondary | ICD-10-CM | POA: Insufficient documentation

## 2014-02-20 DIAGNOSIS — Z87891 Personal history of nicotine dependence: Secondary | ICD-10-CM | POA: Diagnosis not present

## 2014-02-20 DIAGNOSIS — M47812 Spondylosis without myelopathy or radiculopathy, cervical region: Secondary | ICD-10-CM

## 2014-02-20 DIAGNOSIS — H9190 Unspecified hearing loss, unspecified ear: Secondary | ICD-10-CM | POA: Diagnosis not present

## 2014-02-20 DIAGNOSIS — F0280 Dementia in other diseases classified elsewhere without behavioral disturbance: Secondary | ICD-10-CM | POA: Diagnosis not present

## 2014-02-20 DIAGNOSIS — I10 Essential (primary) hypertension: Secondary | ICD-10-CM | POA: Diagnosis not present

## 2014-02-20 DIAGNOSIS — F329 Major depressive disorder, single episode, unspecified: Secondary | ICD-10-CM | POA: Insufficient documentation

## 2014-02-20 DIAGNOSIS — Y92128 Other place in nursing home as the place of occurrence of the external cause: Secondary | ICD-10-CM | POA: Diagnosis not present

## 2014-02-20 NOTE — ED Notes (Signed)
Per EMS pt comes from [name] where he fell out of his wheelchair and hit his forehead. Per facility this is an ongoing issue where pt will try to get up from wheel chair and falls. Pt is alert and oriented per baseline. Pt in KED and C-collar.

## 2014-02-20 NOTE — ED Provider Notes (Signed)
CSN: [id number]     Arrival date & time 02/20/14  0705 History   First MD Initiated Contact with Patient 02/20/14 [phone]     Chief Complaint  Patient presents with  . Fall     (Consider location/radiation/quality/duration/timing/severity/associated sxs/prior Treatment) HPI   Arthur Dunlap is a 74 y.o. male who presents for evaluation of injury to forehead.  He is unable to state what happened.  Minimal information from his nursing care facility indicates that he fell from a wheelchair.  Apparently this is an ongoing issue.  Level V Caveat- dementia   Past Medical History  Diagnosis Date  . Dementia   . Depression   . Hypertension   . Foot drop   . Hearing loss   . Alzheimer disease    Past Surgical History  Procedure Laterality Date  . No past surgeries     History reviewed. No pertinent family history. History  Substance Use Topics  . Smoking status: Former Games developer  . 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 Alcohol Use: No    Review of Systems  Unable to perform ROS     Allergies  Review of patient's allergies indicates no known allergies.  Home Medications   Prior to Admission medications   Medication Sig Start Date End Date Taking? Authorizing Provider  acetaminophen (TYLENOL) 500 MG tablet Take 500 mg by mouth every 4 (four) hours as needed for mild pain, fever or headache. Not to exceed 2gm (or 3gm from all APAP sources)   Yes Historical Provider, MD  alum & mag hydroxide-simeth (MAALOX/MYLANTA) 200-200-20 MG/5ML suspension Take 30 mLs by mouth every 6 (six) hours as needed for indigestion or heartburn.   Yes Historical Provider, MD  atropine 1 % ophthalmic solution Place 2 drops under the tongue every 2 (two) hours as needed (for secretions).   Yes Historical Provider, MD  guaiFENesin (ROBITUSSIN) 100 MG/5ML liquid Take 200 mg by mouth every 6 (six) hours as needed for cough.    Yes Historical Provider, MD   hydrochlorothiazide (HYDRODIURIL) 25 MG tablet Take 25 mg by mouth daily.   Yes Historical Provider, MD  lisinopril (PRINIVIL,ZESTRIL) 20 MG tablet Take 20 mg by mouth daily.   Yes Historical Provider, MD  LORazepam (ATIVAN) 0.5 MG tablet Take 0.5 mg by mouth 2 (two) times daily.   Yes Historical Provider, MD  magnesium hydroxide (MILK OF MAGNESIA) 400 MG/5ML suspension Take 30 mLs by mouth at bedtime as needed for mild constipation.    Yes Historical Provider, MD  metoprolol (LOPRESSOR) 50 MG tablet Take 50 mg by mouth 2 (two) times daily.   Yes Historical Provider, MD  neomycin-bacitracin-polymyxin (NEOSPORIN) [phone] ointment Apply 1 application topically daily. To forehead. Or as needed for skin abrasions/tears   Yes Historical Provider, MD  Nutritional Supplements (NUTRITIONAL SHAKE PO) Take 8 oz by mouth 3 (three) times daily with meals.   Yes Historical Provider, MD  PRESCRIPTION MEDICATION Apply 0.5 mg topically every 6 (six) hours as needed (anxiety agitation). Lorazepam TRANSDERM gel. Do not exceed 4 doses in 24 hours   Yes Historical Provider, MD  risperiDONE (RISPERDAL) 0.25 MG tablet Take 0.25 mg by mouth 2 (two) times daily.   Yes Historical Provider, MD  rivastigmine (EXELON) 3 MG capsule Take 3 mg by mouth 2 (two) times daily.   Yes Historical Provider, MD  traZODone (DESYREL) 50 MG tablet Take 25 mg by mouth every 6 (six) hours as  needed (restlessness/combativeness).   Yes Historical Provider, MD  valproic acid (DEPAKENE) 250 MG capsule Take 250-500 mg by mouth 3 (three) times daily. 2 cap in the morning, 1 cap at 1400, 2 cap at 1800   Yes Historical Provider, MD  cephALEXin (KEFLEX) 500 MG capsule Take 1 capsule (500 mg total) by mouth 2 (two) times daily. Patient not taking: Reported on 01/16/2014 12/29/13   Kristen N Ward, DO   BP 128/56 mmHg  Pulse 89  Temp(Src) 97.8 F (36.6 C) (Oral)  Resp 16  SpO2 98% Physical Exam  Constitutional: He appears well-developed and  well-nourished.  HENT:  Head: Normocephalic.  Right Ear: External ear normal.  Left Ear: External ear normal.  Contusion with swelling right forehead.  No abrasion or laceration of the cranium or face.  Eyes: Conjunctivae and EOM are normal. Pupils are equal, round, and reactive to light. Right eye exhibits no discharge. Left eye exhibits no discharge.  Neck: Normal range of motion and phonation normal. Neck supple.  Cardiovascular: Normal rate, regular rhythm and normal heart sounds.   Pulmonary/Chest: Effort normal and breath sounds normal. He exhibits no bony tenderness.  Abdominal: Soft. There is no tenderness.  Musculoskeletal: Normal range of motion. He exhibits no tenderness.  Immobilized with KED, and cervical collar.  Neurological: He is alert. No cranial nerve deficit or sensory deficit. He exhibits normal muscle tone. Coordination normal.  Increased tone in arms and legs, bilaterally, consistent with nonspecific neuromuscular disorder.  No asymmetry of strength or tone.  Skin: Skin is warm, dry and intact.  Psychiatric: He has a normal mood and affect. His behavior is normal.  Nursing note and vitals reviewed.   ED Course  Procedures (including critical care time)  Medications - No data to display  Patient Vitals for the past 24 hrs:  BP Temp Temp src Pulse Resp SpO2  02/20/14 1251 128/56 mmHg - - 89 16 98 %  02/20/14 1120 136/64 mmHg - - 83 17 100 %  02/20/14 0900 132/73 mmHg - - - - -  02/20/14 0854 - - - - 16 -  02/20/14 0852 118/69 mmHg - - 75 - 100 %  02/20/14 0745 - - - 80 - 100 %  02/20/14 0711 108/57 mmHg 97.8 F (36.6 C) Oral 90 16 100 %   09:20- .  I contacted Frederico HammanJoan Youse, his spouse.  She last saw him 5 days ago.  As far as she knows the patient has been eating well, and not been medically ill.  She relates numerous falls similar to what happened today.  She states that he is due to be transferred to a new care facility, today.  She wants him to leave this  emergency department and go directly to that facility.  I will have our nursing staff, and social work staff and see if they can facilitate that.  13:45- in conjunction with case management, and FL2 was filled out, and signed by me.  1:47 PM Reevaluation with update and discussion. After initial assessment and treatment, an updated evaluation reveals exam is unchanged.  He had one fall in the emergency department without injury. Areil Ottey L     Labs Review Labs Reviewed - No data to display  Imaging Review Dg Cervical Spine Complete  02/20/2014   CLINICAL DATA:  Fall from wheelchair.  Fixed position of the head.  EXAM: CERVICAL SPINE  4+ VIEWS  COMPARISON:  12/23/2013  FINDINGS: The C6, C7, and T1 vertebra are obscured on  the lateral projection.  No prevertebral soft tissue swelling. Loss of disc height at C4-5 and C5-6.  A cervical collar is in place.  Multilevel uncinate spurring noted.  IMPRESSION: 1. Cervical spine CT recommended. Visualization of C6, C7, and T1 as inadequate despite swimmer's view. Patient mobility does not allow for standard imaging and the underlying cervical spondylosis and degenerative disc disease also impairs the negative predictive value of conventional cervical spine radiography for this patient.   Electronically Signed   By: Herbie Baltimore M.D.   On: 02/20/2014 08:25   Ct Head Wo Contrast  02/20/2014   CLINICAL DATA:  Status post fall today with a blow to the head. Initial encounter.  EXAM: CT HEAD WITHOUT CONTRAST  TECHNIQUE: Contiguous axial images were obtained from the base of the skull through the vertex without intravenous contrast.  COMPARISON:  Head CT scan 12/29/2013.  FINDINGS: A contusion is seen over the right frontal bone without underlying fracture or foreign body. The brain is atrophic with chronic microvascular ischemic change but no evidence of acute abnormality including infarct, hemorrhage, mass lesion, mass effect, midline shift or abnormal  extra-axial fluid collection is identified. There is no hydrocephalus or pneumocephalus. Small mucous retention cyst or polyp is seen in the right maxillary sinus.  IMPRESSION: Soft tissue contusion over the right frontal bone without underlying fracture or acute intracranial abnormality.  Atrophy and chronic microvascular ischemic change.   Electronically Signed   By: Drusilla Kanner M.D.   On: 02/20/2014 08:19   Ct Cervical Spine Wo Contrast  02/20/2014   CLINICAL DATA:  Fall, hit forehead.  EXAM: CT CERVICAL SPINE WITHOUT CONTRAST  TECHNIQUE: Multidetector CT imaging of the cervical spine was performed without intravenous contrast. Multiplanar CT image reconstructions were also generated.  COMPARISON:  12/23/2013.  FINDINGS: There is reversal of the normal cervical lordosis. Alignment is otherwise anatomic. No fracture. Multilevel endplate degenerative changes, loss of disc space height and facet hypertrophy, which result in multilevel neural foraminal narrowing.  Lung apices show no acute findings. Low-attenuation nodule in the right lobe of the thyroid measures 1.9 cm somewhat a prior exams. Atherosclerotic calcification of the arterial vasculature. Scattered lymph nodes are not enlarged by CT size criteria. Probable 13 mm sebaceous cyst along the right posterior neck.  IMPRESSION: 1. Reversal of the normal cervical lordosis without subluxation or fracture. 2. Multilevel spondylosis.   Electronically Signed   By: Leanna Battles M.D.   On: 02/20/2014 09:45     EKG Interpretation None      MDM   Final diagnoses:  Fall, initial encounter  Head injury, initial encounter  Degenerative joint disease of cervical spine    Evaluation today is consistent with mechanical fall from wheelchair, without serious injury.  I doubt a preceding medical cause for the fall, other than his chronic disability.    Nursing Notes Reviewed/ Care Coordinated Applicable Imaging Reviewed Interpretation of  Laboratory Data incorporated into ED treatment  The patient appears reasonably screened and/or stabilized for discharge and I doubt any other medical condition or other [facility] requiring further screening, evaluation, or treatment in the ED at this time prior to discharge.  Plan: Home Medications- usual; Home Treatments- rest; return here if the recommended treatment, does not improve the symptoms; Recommended follow up- PCP follow-up one week, return here if needed, for problems.   Flint Melter, MD 02/20/14 1351

## 2014-02-20 NOTE — ED Notes (Signed)
Patient found sitting on the floor, holding onto the sink. Both bedrails are up, patient fell out of the bed from the end of the bed. No obvious deformities. Right upper forehead bruise noted on head. Unsure if this is from previous fall. Bed alarm applied. MD Effie ShyWentz notified.

## 2014-02-20 NOTE — ED Notes (Signed)
Patient transported to X-ray 

## 2014-02-20 NOTE — ED Notes (Signed)
AVS explained to PTAR. All [facility] and forms [facility] signed by MD Effie ShyWentz. No other questions/concerns.

## 2014-02-20 NOTE — ED Notes (Signed)
Bed: [id number] Expected date: 02/20/14 Expected time: 6:53 AM Means of arrival: Ambulance Comments: 74 yo M  Fall, head injury

## 2014-02-20 NOTE — Progress Notes (Signed)
Late entry note for 02/20/14 1210 Pt assisted to bed with CNA after noted he scooted out of end of bed, noted in praying position at bedside chair.  Pt nonverbal. No injury noted to knees, hands, face nor head EDP, Effie ShyWentz updated

## 2014-02-20 NOTE — Progress Notes (Addendum)
Clinical Social Work  CSW received a call from RN reporting that [facility] [place] [facility] but wife Aurea Graff(Joan [phone]) is [facility]. CSW called wife and left a message with CSW contact information and requested a return call to assist with DC planning.  Unk LightningHolly Gerber, LCSW (Coverage for Olga CoasterKristen Reed)  Addendum 1115 CSW spoke with dtr Marchelle Folks(Amanda) who reports she and family had worked with [facility] re: [facility] to their facility. CSW [facility] with [facility] who is agreeable to accept patient today. CSW spoke with ED MD Effie Shy(Wentz) on DC plans and explained that FL2 needs to be signed and DC note for SNF to have medication list and follow up instructions. CSW will assist with DC once ED MD has completed note and signed FL2.  Addendum 2:03 pm CSW received confirmation from RN that MD had signed FL2 and completed discharge note. RN has discharge packet and will place dc note in packet. CSW sent clinicals to [facility]. RN called PTAR. CSW notified pt daughter, Marchelle Folksmanda via telephone. No further social work needs identified. CSW signing off.   Loletta SpecterSuzanna Jaritza Duignan, MSW, LCSW Clinical Social Work (coverage for [employer]en Reed, [place])

## 2014-02-20 NOTE — Discharge Instructions (Signed)
Fall Prevention and Home Safety Falls cause injuries and can affect all age groups. It is possible to prevent falls.  HOW TO PREVENT FALLS  Wear shoes with rubber soles that do not have an opening for your toes.  Keep the inside and outside of your house well lit.  Use night lights throughout your home.  Remove clutter from floors.  Clean up floor spills.  Remove throw rugs or fasten them to the floor with carpet tape.  Do not place electrical cords across pathways.  Put grab bars by your tub, shower, and toilet. Do not use towel bars as grab bars.  Put handrails on both sides of the stairway. Fix loose handrails.  Do not climb on stools or stepladders, if possible.  Do not wax your floors.  Repair uneven or unsafe sidewalks, walkways, or stairs.  Keep items you use a lot within reach.  Be aware of pets.  Keep emergency numbers next to the telephone.  Put smoke detectors in your home and near bedrooms. Ask your doctor what other things you can do to prevent falls. Document Released: 11/23/2008 Document Revised: 07/29/2011 Document Reviewed: 04/29/2011 [facility] Patient Information 2015 [place], [place]. This information is not intended to replace advice given to you by your health care provider. Make sure you discuss any questions you have with your health care provider.  Cryotherapy Cryotherapy is when you put ice on your injury. Ice helps lessen pain and puffiness (swelling) after an injury. Ice works the best when you start using it in the first 24 to 48 hours after an injury. HOME CARE  Put a dry or damp towel between the ice pack and your skin.  You may press gently on the ice pack.  Leave the ice on for no more than 10 to 20 minutes at a time.  Check your skin after 5 minutes to make sure your skin is okay.  Rest at least 20 minutes between ice pack uses.  Stop using ice when your skin loses feeling (numbness).  Do not use ice on someone who cannot tell you  when it hurts. This includes small children and people with memory problems (dementia). GET HELP RIGHT AWAY IF:  You have white spots on your skin.  Your skin turns blue or pale.  Your skin feels waxy or hard.  Your puffiness gets worse. MAKE SURE YOU:   Understand these instructions.  Will watch your condition.  Will get help right away if you are not doing well or get worse. Document Released: 07/16/2007 Document Revised: 04/21/2011 Document Reviewed: 09/19/2010 [facility] Patient Information 2015 [place], [place]. This information is not intended to replace advice given to you by your health care provider. Make sure you discuss any questions you have with your health care provider.  Head Injury You have received a head injury. It does not appear serious at this time. Headaches and vomiting are common following head injury. It should be easy to awaken from sleeping. Sometimes it is necessary for you to stay in the emergency department for a while for observation. Sometimes admission to the hospital may be needed. After injuries such as yours, most problems occur within the first 24 hours, but side effects may occur up to 7-10 days after the injury. It is important for you to carefully monitor your condition and contact your health care provider or seek immediate medical care if there is a change in your condition. WHAT ARE THE TYPES OF HEAD INJURIES? Head injuries can be as  minor as a bump. Some head injuries can be more severe. More severe head injuries include:  A jarring injury to the brain (concussion).  A bruise of the brain (contusion). This mean there is bleeding in the brain that can cause swelling.  A cracked skull (skull fracture).  Bleeding in the brain that collects, clots, and forms a bump (hematoma). WHAT CAUSES A HEAD INJURY? A serious head injury is most likely to happen to someone who is in a car wreck and is not wearing a seat belt. Other causes of major head  injuries include bicycle or motorcycle accidents, sports injuries, and falls. HOW ARE HEAD INJURIES DIAGNOSED? A complete history of the event leading to the injury and your current symptoms will be helpful in diagnosing head injuries. Many times, pictures of the brain, such as CT or MRI are needed to see the extent of the injury. Often, an overnight hospital stay is necessary for observation.  WHEN SHOULD I SEEK IMMEDIATE MEDICAL CARE?  You should get help right away if:  You have confusion or drowsiness.  You feel sick to your stomach (nauseous) or have continued, forceful vomiting.  You have dizziness or unsteadiness that is getting worse.  You have severe, continued headaches not relieved by medicine. Only take over-the-counter or prescription medicines for pain, fever, or discomfort as directed by your health care provider.  You do not have normal function of the arms or legs or are unable to walk.  You notice changes in the black spots in the center of the colored part of your eye (pupil).  You have a clear or bloody fluid coming from your nose or ears.  You have a loss of vision. During the next 24 hours after the injury, you must stay with someone who can watch you for the warning signs. This person should contact local emergency services (911 in the [place] if you have seizures, you become unconscious, or you are unable to wake up. HOW CAN I PREVENT A HEAD INJURY IN THE FUTURE? The most important factor for preventing major head injuries is avoiding motor vehicle accidents. To minimize the potential for damage to your head, it is crucial to wear seat belts while riding in motor vehicles. Wearing helmets while bike riding and playing collision sports (like football) is also helpful. Also, avoiding dangerous activities around the house will further help reduce your risk of head injury.  WHEN CAN I RETURN TO NORMAL ACTIVITIES AND ATHLETICS? You should be reevaluated by your health care  provider before returning to these activities. If you have any of the following symptoms, you should not return to activities or contact sports until 1 week after the symptoms have stopped:  Persistent headache.  Dizziness or vertigo.  Poor attention and concentration.  Confusion.  Memory problems.  Nausea or vomiting.  Fatigue or tire easily.  Irritability.  Intolerant of bright lights or loud noises.  Anxiety or depression.  Disturbed sleep. MAKE SURE YOU:   Understand these instructions.  Will watch your condition.  Will get help right away if you are not doing well or get worse. Document Released: 01/27/2005 Document Revised: 02/01/2013 Document Reviewed: 10/04/2012 [facility] Patient Information 2015 [place], [place]. This information is not intended to replace advice given to you by your health care provider. Make sure you discuss any questions you have with your health care provider.

## 2014-02-20 NOTE — Progress Notes (Signed)
ED CM received a call about assisting pt [facility] Referred to SW

## 2014-10-12 DEATH — deceased

## 2015-09-29 IMAGING — CR DG CHEST 2V
1 series · 1 of 1 positions shown · non-contrast
Comparison: DG CHEST 2 VIEW dated 04/23/2013

CLINICAL DATA: Infiltrate

EXAM:
CHEST - 2 VIEW

[w chest lat]
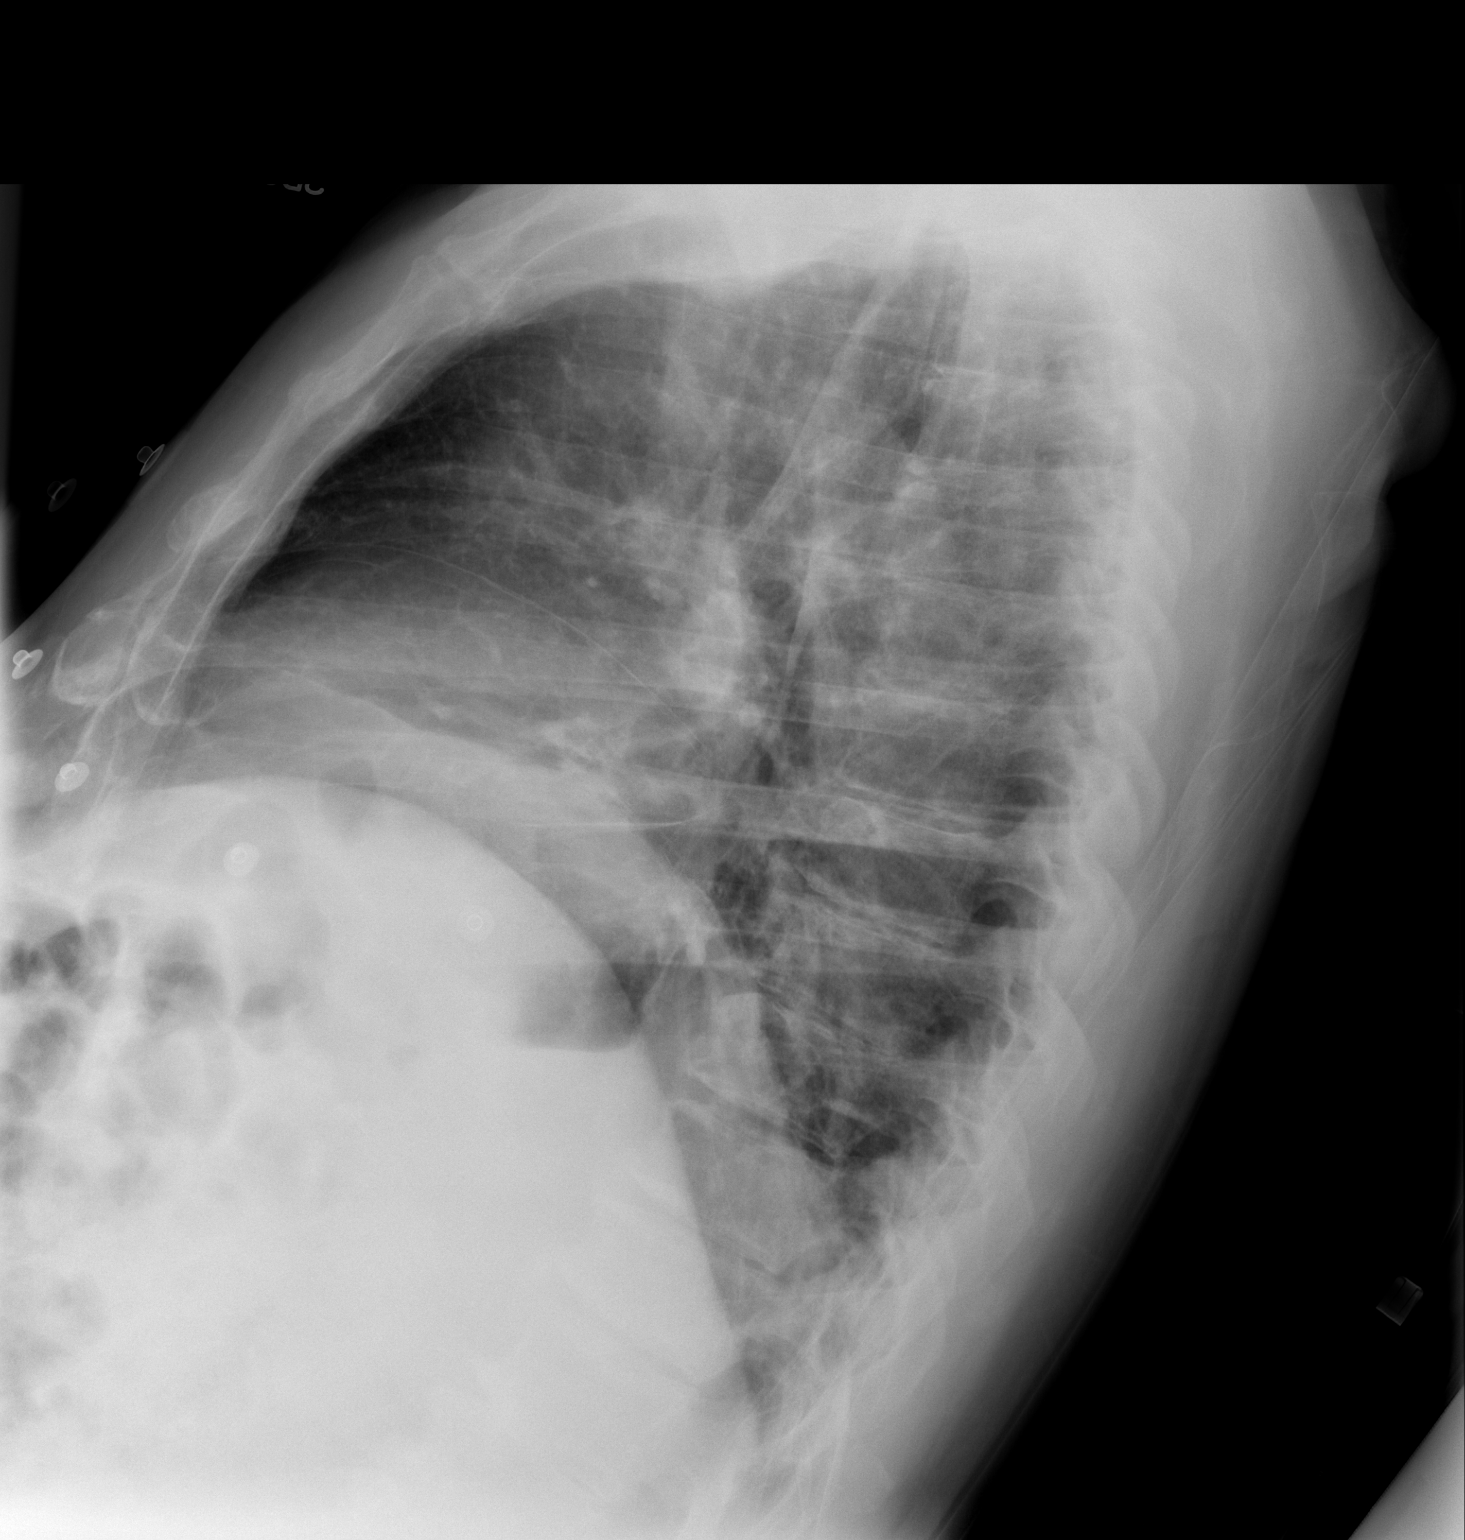

[1 of 1 positions shown; findings below may reference images not displayed]

FINDINGS: Scarring versus atelectasis at the left base is stable. Low volumes.
Upper normal heart size. No pneumothorax. Right lung is clear.
IMPRESSION: Stable scarring worse atelectasis at the left base.

## 2016-03-11 IMAGING — CT CT HEAD W/O CM
4 series · 17 of 30 positions shown, 19 images · non-contrast
Comparison: 11/07/2013 head CT 08/30/2013 cervical spine CT

CLINICAL DATA: Dementia with fall and left frontal laceration.
Initial in caliber

EXAM:
CT HEAD WITHOUT CONTRAST
CT CERVICAL SPINE WITHOUT CONTRAST
TECHNIQUE: Multidetector CT imaging of the head and cervical spine was
performed following the standard protocol without intravenous
contrast. Multiplanar CT image reconstructions of the cervical spine
were also generated.

[Series 3: head w/o · axial · non-contrast · 0.42mm/px · z∈[-47,+8]mm · 2 of 34 slices shown]
[im 12/34  brain]
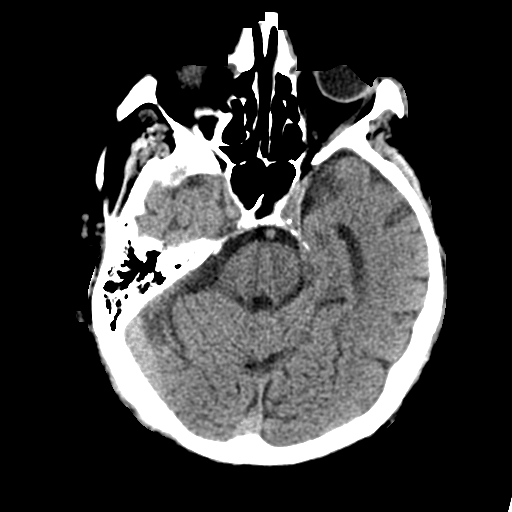
[im 23/34  brain]
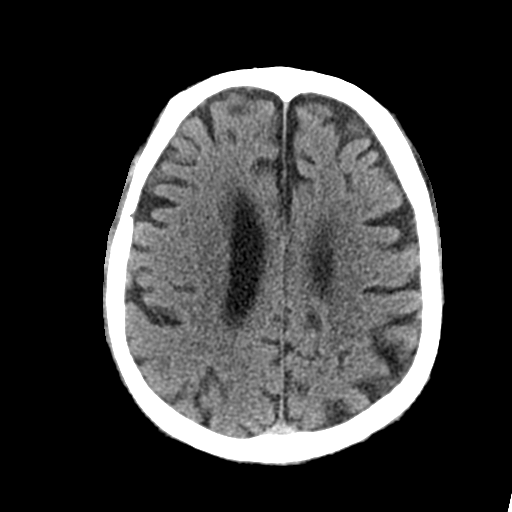

[Series 4: bone windows · axial · 0.42mm/px · z∈[-69,+30]mm · 4 of 56 slices shown]
[im 12/56  bone]
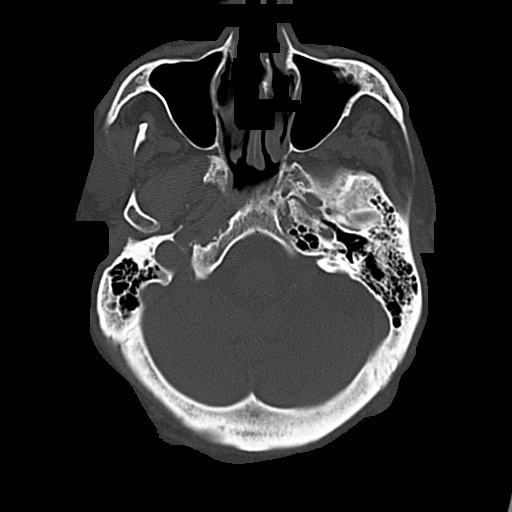
[im 23/56  bone]
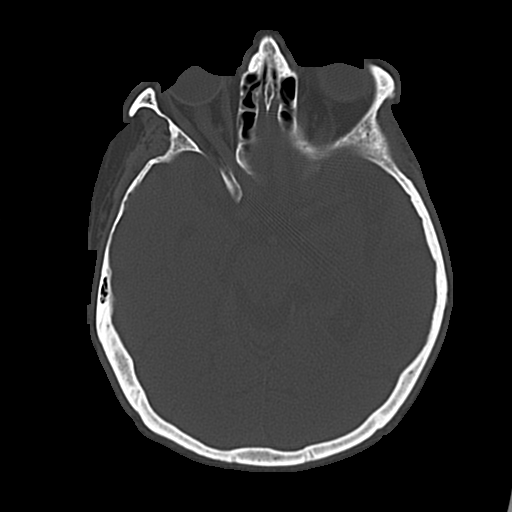
[im 34/56  bone]
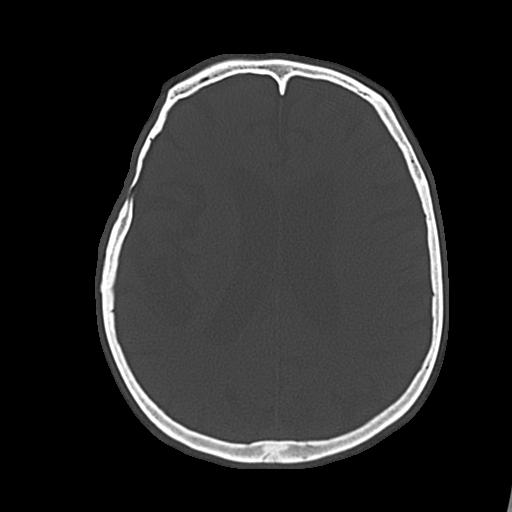
[im 45/56  bone]
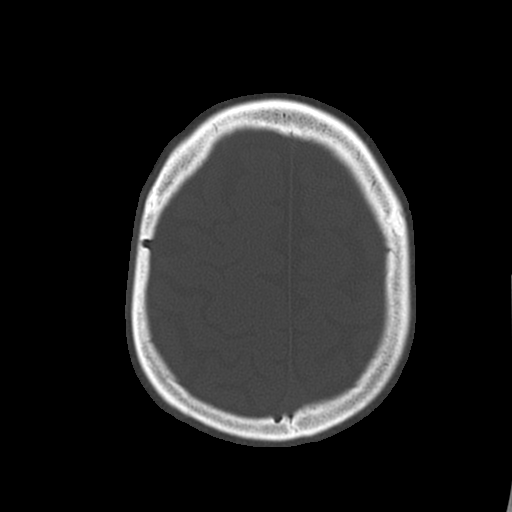

[Series 5: c-spine st · axial · 0.25mm/px · z∈[-224,-178]mm · 3 of 103 slices shown]
[im 12/103  brain]
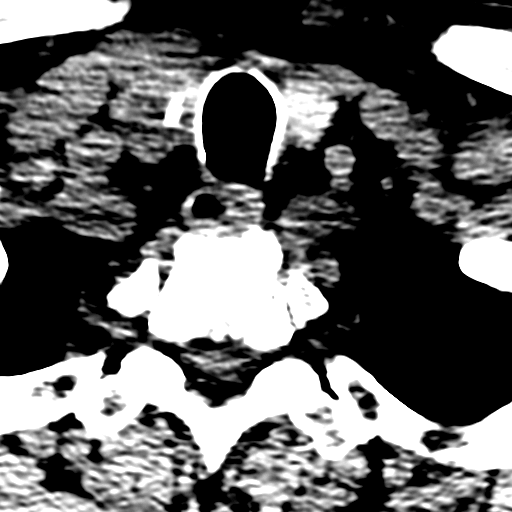
[im 23/103  brain]
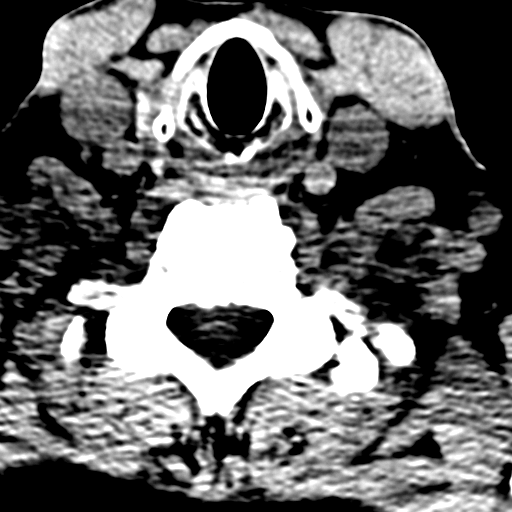
[im 35/103  brain]
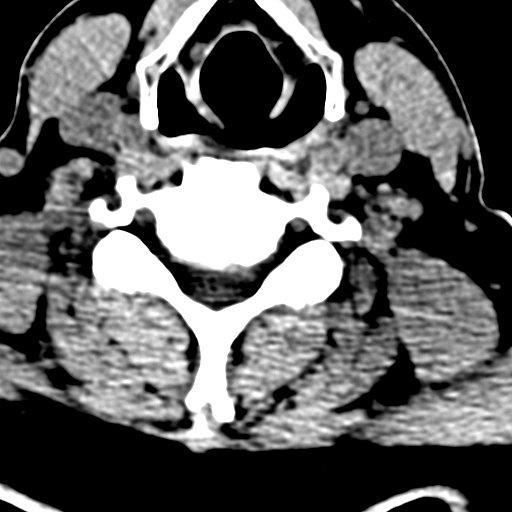

[Series 8: axial reformats · axial · 0.23mm/px · z∈[-247,-98]mm · 8 of 102 slices shown, 10 images]
[im 12/102  brain]
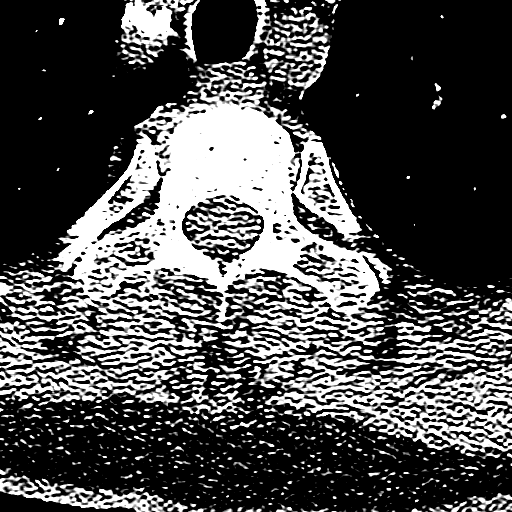
[im 12/102  bone]
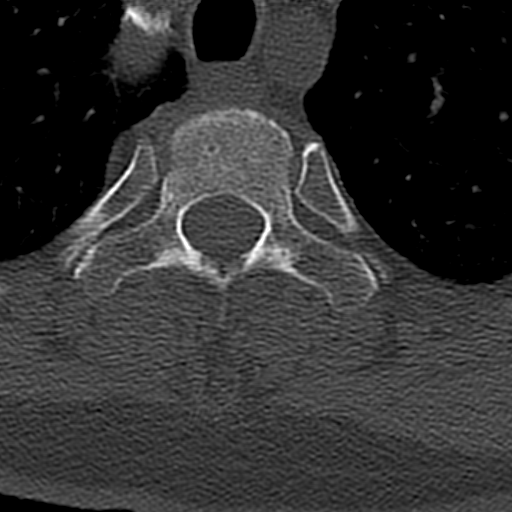
[im 23/102  brain]
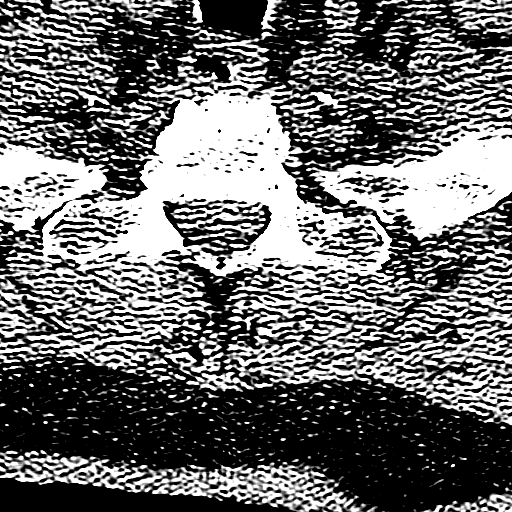
[im 34/102  brain]
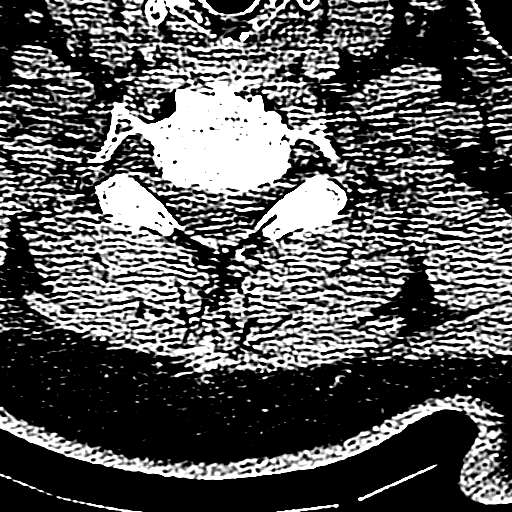
[im 45/102  brain]
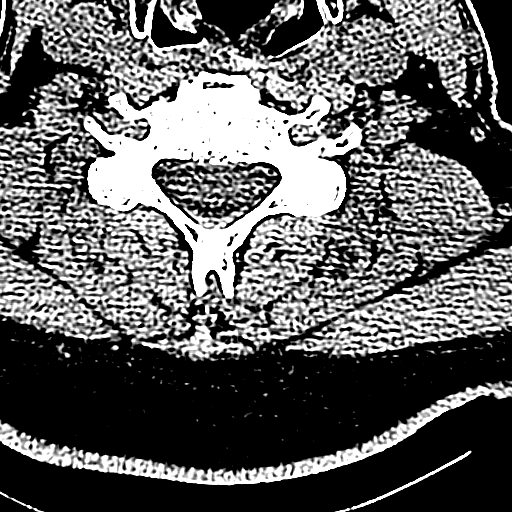
[im 57/102  brain]
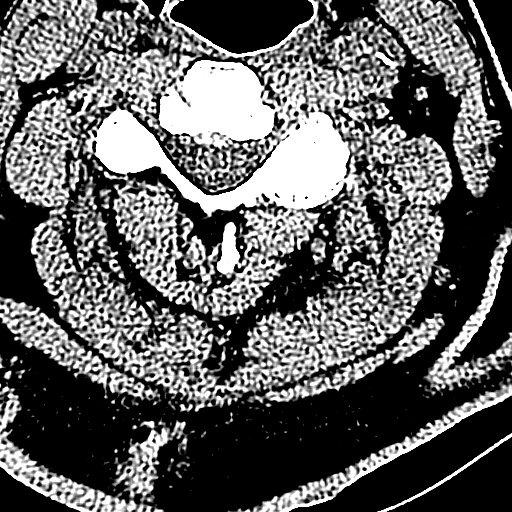
[im 57/102  bone]
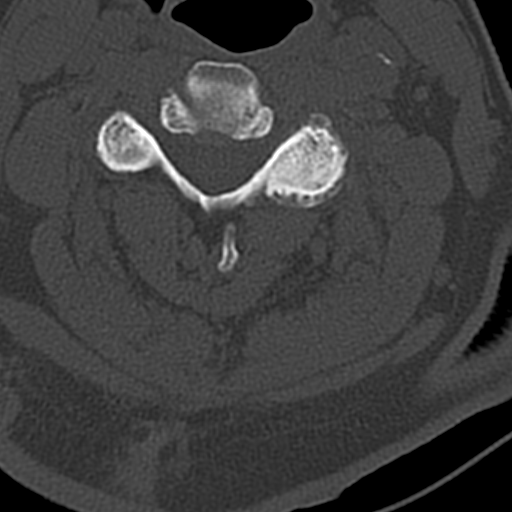
[im 68/102  brain]
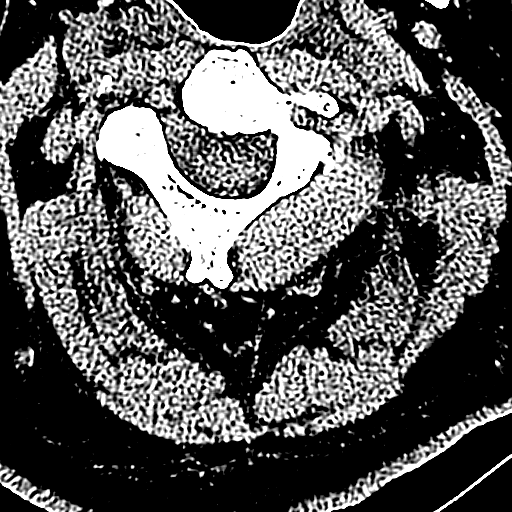
[im 79/102  brain]
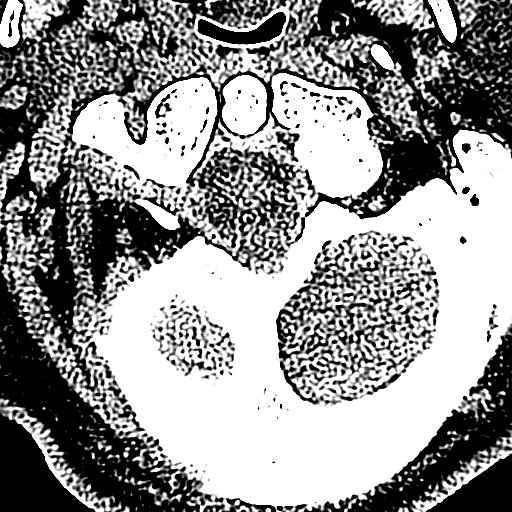
[im 90/102  brain]
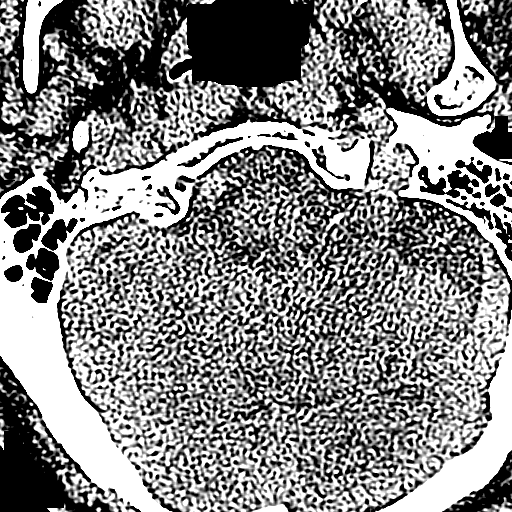

[17 of 30 positions shown; findings below may reference images not displayed]

FINDINGS: CT HEAD FINDINGS

Skull and Sinuses:Negative for fracture or destructive process. The
mastoids, middle ears, and imaged paranasal sinuses are clear.

Orbits: No acute abnormality.

Brain: No evidence of acute abnormality, such as acute infarction,
hemorrhage, hydrocephalus, or mass lesion/mass effect. There is
generalized cerebral volume loss which is age advanced. Prominent
medial temporal lobe volume loss correlating with history of
Alzheimer's disease. Mild for age small-vessel disease with ischemic
gliosis best seen around the frontal horns lateral ventricles.
Remote lacunar infarct in the genu of the left internal capsule.

CT CERVICAL SPINE FINDINGS

Negative for acute fracture or subluxation. No prevertebral edema.
No gross cervical canal hematoma. Diffuse spondylotic endplate
changes and disc narrowing. No evidence of advanced foraminal or
canal stenosis. 19 mm nodule in the right thyroid gland, considered
incidental based on clinical circumstances. No significant osseous
canal or foraminal stenosis.
IMPRESSION: 1. No evidence of acute intracranial or cervical spine injury.
2. Chronic findings are described above.
